# Patient Record
Sex: Male | Born: 1960 | Race: White | Hispanic: No | Marital: Married | Smoking: Never smoker
Health system: Southern US, Community
[De-identification: ages and names within clinical notes are randomized; demographics above are authoritative.]

## PROBLEM LIST (undated history)

## (undated) DIAGNOSIS — I739 Peripheral vascular disease, unspecified: Secondary | ICD-10-CM

## (undated) DIAGNOSIS — Z9289 Personal history of other medical treatment: Secondary | ICD-10-CM

## (undated) DIAGNOSIS — M479 Spondylosis, unspecified: Secondary | ICD-10-CM

## (undated) DIAGNOSIS — K589 Irritable bowel syndrome without diarrhea: Secondary | ICD-10-CM

## (undated) DIAGNOSIS — I10 Essential (primary) hypertension: Secondary | ICD-10-CM

## (undated) DIAGNOSIS — E785 Hyperlipidemia, unspecified: Secondary | ICD-10-CM

## (undated) HISTORY — DX: Personal history of other medical treatment: Z92.89

## (undated) HISTORY — PX: TONSILLECTOMY: SUR1361

## (undated) HISTORY — DX: Peripheral vascular disease, unspecified: I73.9

## (undated) HISTORY — DX: Irritable bowel syndrome, unspecified: K58.9

## (undated) HISTORY — PX: VASECTOMY: SHX75

## (undated) HISTORY — DX: Spondylosis, unspecified: M47.9

## (undated) HISTORY — DX: Essential (primary) hypertension: I10

## (undated) HISTORY — DX: Hyperlipidemia, unspecified: E78.5

---

## 1998-01-17 ENCOUNTER — Encounter: Admission: RE | Admit: 1998-01-17 | Discharge: 1998-04-17 | Payer: Self-pay | Admitting: *Deleted

## 1999-01-25 ENCOUNTER — Encounter: Payer: Self-pay | Admitting: Family Medicine

## 1999-01-25 ENCOUNTER — Ambulatory Visit (HOSPITAL_COMMUNITY): Admission: RE | Admit: 1999-01-25 | Discharge: 1999-01-25 | Payer: Self-pay | Admitting: Family Medicine

## 2001-04-07 ENCOUNTER — Inpatient Hospital Stay (HOSPITAL_COMMUNITY): Admission: EM | Admit: 2001-04-07 | Discharge: 2001-04-08 | Payer: Self-pay | Admitting: Gastroenterology

## 2003-09-18 LAB — HM COLONOSCOPY

## 2003-09-19 ENCOUNTER — Encounter: Payer: Self-pay | Admitting: Gastroenterology

## 2003-09-28 ENCOUNTER — Emergency Department (HOSPITAL_COMMUNITY): Admission: EM | Admit: 2003-09-28 | Discharge: 2003-09-28 | Payer: Self-pay | Admitting: Emergency Medicine

## 2009-06-01 LAB — POC HEMOCCULT BLD/STL (HOME/3-CARD/SCREEN)

## 2010-04-20 ENCOUNTER — Telehealth: Payer: Self-pay | Admitting: Gastroenterology

## 2010-06-19 NOTE — Procedures (Signed)
Summary: colonoscopy   Colonoscopy  Procedure date:  09/19/2003  Findings:      Location:  [facility].   Patient Name: Levi Riddle, Levi Riddle MRN: [id number] Procedure Procedures: Colonoscopy CPT: 54627.  Personnel: Endoscopist: Vania Rea. Jarold Motto, MD.  Exam Location: Exam performed in Outpatient Clinic. Outpatient  Patient Consent: Procedure, Alternatives, Risks and Benefits discussed, consent obtained, from patient. Consent was obtained by the RN.  Indications Symptoms: Constipation Patient's stools are infrequent. Abdominal pain / bloating.  History  Current Medications: Patient is not currently taking Coumadin.  Pre-Exam Physical: Performed Sep 19, 2003. Cardio-pulmonary exam, Rectal exam, Abdominal exam, Extremity exam, Mental status exam WNL.  Exam Exam: Extent of exam reached: Cecum, extent intended: Cecum.  The cecum was identified by appendiceal orifice and IC valve. Patient position: on left side. Duration of exam: 20 minutes. Colon retroflexion performed. Images taken. ASA Classification: I. Tolerance: excellent.  Monitoring: Pulse and BP monitoring, Oximetry used. Supplemental O2 given. at 2 Liters.  Colon Prep Used Golytely for colon prep. Prep results: excellent.  Sedation Meds: Patient assessed and found to be appropriate for moderate (conscious) sedation. Fentanyl 100 mcg. given IV. Versed 10 mg. given IV.  Instrument(s): CF 140L. Serial [id number].  Findings - NORMAL EXAM: Cecum to Rectum. Not Seen: Polyps. AVM's. Colitis. Tumors. Melanosis. Crohn's. Diverticulosis. Hemorrhoids.   Assessment Normal examination.  Events  Unplanned Interventions: No intervention was required.  Plans Medication Plan: Continue current medications.  Disposition: After procedure patient sent to recovery. After recovery patient sent home.  Scheduling/Referral: Follow-Up prn.  cc:  Rudi Heap, MD  This report was created from the original endoscopy  report, which was reviewed and signed by the above listed endoscopist.

## 2010-06-19 NOTE — Progress Notes (Signed)
Summary: next COL?  Phone Note From Other Clinic Call back at [phone]   Caller: Thelma Barge, nurse Call For: Dr. Jarold Motto Reason for Call: Schedule Patient Appt Summary of Call: Dr. Christell Constant would like to know when pt is due for next COL... last one in 2005 Initial call taken by: Vallarie Mare,  April 20, 2010 8:53 AM  Follow-up for Phone Call        Dr. Jarold Motto last colon was 09/19/2003. Is 09/18/2013 correct recall date? According to our records no family history of colon ca.  Follow-up by: Selinda Michaels RN,  April 20, 2010 9:24 AM    Additional Follow-up for Phone Call Additional follow up Details #2::    He previously was having bowel issues. I will be glad to see him in the office. Standard protocol followup for his colonoscopy would be in another 5 years. Thanks. Follow-up by: Mardella Layman MD [facility],  April 20, 2010 10:04 AM  Additional Follow-up for Phone Call Additional follow up Details #3:: Details for Additional Follow-up Action Taken: Spoke with Thelma Barge at Dr [facility] office and let her know the repeat Colon date would be 2015 per Dr. Jarold Motto. Recall date entered.

## 2010-08-10 ENCOUNTER — Encounter: Payer: Self-pay | Admitting: Family Medicine

## 2010-08-10 DIAGNOSIS — I739 Peripheral vascular disease, unspecified: Secondary | ICD-10-CM

## 2010-08-10 DIAGNOSIS — N400 Enlarged prostate without lower urinary tract symptoms: Secondary | ICD-10-CM | POA: Insufficient documentation

## 2010-08-10 DIAGNOSIS — M479 Spondylosis, unspecified: Secondary | ICD-10-CM | POA: Insufficient documentation

## 2010-08-10 DIAGNOSIS — K589 Irritable bowel syndrome without diarrhea: Secondary | ICD-10-CM

## 2010-08-10 DIAGNOSIS — I10 Essential (primary) hypertension: Secondary | ICD-10-CM | POA: Insufficient documentation

## 2010-08-10 DIAGNOSIS — E785 Hyperlipidemia, unspecified: Secondary | ICD-10-CM | POA: Insufficient documentation

## 2010-10-05 NOTE — Discharge Summary (Signed)
[facility]  Patient:    Levi Riddle, Levi Riddle Visit Number: [id number] MRN: [id number]          Service Type: MED Location: [id number] [phone] Attending Physician:  Mardella Layman Dictated by:   Sammuel Cooper, P.A. Admit Date:  04/07/2001 Discharge Date: 04/08/2001   CC:         Monica Becton, M.D.   Discharge Summary  ADMITTING DIAGNOSES: 30. A 50 year old white male with acute gastrointestinal bleed, rule out upper    versus lower source, possible aspirin-induced peptic ulcer disease. 2. Anemia secondary to above, acute normocytic. 3. Tachycardia secondary to intravascular volume depletion. 4. Positive family history of colon cancer.  DISCHARGE DIAGNOSES: 1. Stable status post acute upper gastrointestinal bleed secondary to    aspirin-induced duodenal ulcer.  CLOtest done and pending, not found in the    chart at the time of dictation. 2. Normal colonoscopy. 3. Anemia secondary to acute upper gastrointestinal bleed, stable status post    transfusion.  PROCEDURES:  Upper endoscopy and colonoscopy per Dr. Yancey Flemings and transfusion x 2.  CONSULTATIONS:  None.  BRIEF HISTORY:  Levi Riddle is a pleasant 50 year old white male who was referred to the office via Dr. Charise Killian office in [place] with black stools. Patient had started having melena on April 02, 2001, had four bowel movements that day, had been having two to three bowel movements each day since.  Over the past 48 hours, his stools apparently had become more normal in color.  He had been seen by Dr. Buel Ream PA on April 03, 2001, was noted to be heme positive with hemoglobin of 11.8.  At this time, he presents to our office complaining of feeling very pale and "exhausted" and has had dizziness with standing and a sensation of a racing pulse over the past 24 hours or so. He denied any abdominal pain, cramping, nausea, or vomiting.  His appetite has been fine, his weight had been  stable.  He had no complaints of heartburn, indigestion, or dysphagia.  He had been taking two aspirin perhaps three times per week but no other regular medications.  He is a nonsmoker, nondrinker. Patient was seen and evaluated in the office, noted to be tachycardic with a pulse of 120 resting.  Stool was dark brown and heme positive.  The patient was admitted to the hospital with an acute upper GI bleed for stabilization and transfusions, and diagnostic workup.  ADMITTING LABS:  On November 19, WBC 7.4, hemoglobin 8.1, hematocrit 23.4, MCV 92.7, platelets 270.  Follow-up post transfusion on November 20, hemoglobin 9.4, hematocrit 27.7, pro time 12.8, INR 1.0.  Electrolytes within normal limits, BUN 10, creatinine 1.0, albumin 4.  Liver function studies normal with the exception of an SGPT of 41.  Blood type O positive.  HOSPITAL COURSE:  Patient was admitted to the service of Dr. Sheryn Bison and then cared for by Dr. Marina Goodell who was covering the hospital.  He was initially started on IV fluids at 100 cc an hour, started on IV Protonix. Baseline labs were obtained and, as suspected, his hemoglobin was in the 8 range.  He was then transfused two units of packed RBCs.  The patient was scheduled to undergo upper endoscopy and colonoscopy per Dr. Yancey Flemings on November 19.  He tolerated this prep without difficulty.  There was some concern about possible lower GI source given positive family history of colon cancer and lack of other symptomatology.  Colonoscopy was  normal exam and upper endoscopy showed a duodenal ulcer 10 mm which was clean based.  CLOtest was done; however, results were pending on discharge and are not found in the chart at the time of this dictation.  This will be followed up on an outpatient basis.  The patient was anxious for discharge and was allowed discharge to home later that day on November 20.  He was to follow up with Dr. Jarold Motto December 17 at 3:45 p.m., to  call for any problems with recurrent bleeding or other problems in the interim.  He was asked to go by Dr. Buel Ream office in four to five days to repeat his hemoglobin.  CONDITION ON DISCHARGE:  Stable and improved.  DIET:  Regular.  MEDICATIONS:  Protonix 40 b.i.d. x 2 weeks and then q.d. thereafter.  If he is CLO positive, will treat as an outpatient.  FOLLOW-UP:  Plan follow-up routine colonoscopy in five years. Dictated by:   Sammuel Cooper, P.A. Attending Physician:  Mardella Layman DD:  04/15/01 TD:  04/15/01 Job: [id number]

## 2010-10-05 NOTE — H&P (Signed)
[facility]  Patient:    Levi Riddle, Levi Riddle Visit Number: [id number] MRN: [id number]          Service Type: Attending:  Barry Dienes. Eloise Harman, M.D. Dictated by:   Sammuel Cooper, P.A.C.   CC:         Dr. Christell Constant, [place], [place]   History and Physical  DATE OF BIRTH:  2060/10/15  CHIEF COMPLAINT:  Black stools and weakness.  HISTORY OF PRESENT ILLNESS:  Levi Riddle is a 50 year old white male generally in good health with no known chronic medical problems.  He had acute onset on Thursday, April 02, 2001, with black bowel movements and had approximately four bowel movements that day.  He says he had about three bowel movements the following day and then two bowel movements each day thereafter, all black.  He did not notice any bright red or burgundy stool.  Yesterday and today, his bowel movements have been "more normal in color."  He was seen by by Dr. Buel Ream office on Friday, April 03, 2001, was noted to be heme positive and had a hemoglobin of 11.8 with normocytic indices that day.  He was referred here for further evaluation.  The patient denies any abdominal pain, cramping, nausea, or vomiting.  He states that his appetite has been fine and his weight has been stable.  He denies any chronic problems with heartburn, indigestion, dysphagia, or odynophagia.  He says that his bowels have been normal until acute onset of his symptoms last Thursday.  He does take two aspirin, perhaps three times per week.  No other regular medications.  No ETOH and no tobacco.  His family history is positive for colon cancer in his mother, who was diagnosed at 49 and died at 7.  The patient reports that he had a flexible sigmoidoscopy approximately 10 years ago, which was normal.  He does not remember the physicians name who did the procedure.  He was seen and evaluated in our office today, noted to be quite pale and complaining of feeling "exhausted."  He is dizzy with  standing and says that his pulse has been racing over the past couple of days.  He is admitted at this time for hydration and transfusions as indicated and a further diagnostic work-up for acute GI bleed with EGD and colonoscopy.  CURRENT MEDICATIONS:  None regular.  ALLERGIES:  No known drug allergies.  PAST MEDICAL HISTORY:  Benign.  No surgeries or chronic illnesses.  SOCIAL HISTORY:  The patient is married.  He has two children.  He is employed as a Landscape architect.  No ETOH and no tobacco.  FAMILY HISTORY:  Pertinent for metastatic colon CA in his mother, deceased in her early 37s.  Father alive at 70 with prostate CA.  There are no other family members with colon cancers, polyps, or inflammatory bowel disease.  REVIEW OF SYSTEMS:  Cardiovascular:  Pertinent for palpitations and heart racing over the past few days.  No chest pain.  Pulmonary:  Pertinent for shortness of breath with exertion over the past few days.  Genitourinary: Negative for dysuria, urgency, or frequency.  GI:  As outlined above.  PHYSICAL EXAMINATION:  A well-developed white male in no acute distress.  He is alert and oriented x 3.  He is quite pale.  VITAL SIGNS:  The pulse is 120 lying and blood pressure 156/84.  WEIGHT:  190 pounds.  HEENT:  Normocephalic and atraumatic.  EOMI.  PERRLA.  Sclerae anicteric.  The conjunctivae are  pale.  CARDIOVASCULAR:  Tachy regular rhythm with S1 and S2.  PULMONARY:  Clear to A&P.  ABDOMEN:  Soft.  Bowel sounds are active.  He is nontender.  There is no mass or hepatosplenomegaly.  RECTAL:  Brown stool, 3+ positive for blood.  There is no mass.  EXTREMITIES:  Without clubbing, cyanosis, or edema.  LABORATORY DATA:  Pending at the time of this dictation.  IMPRESSION: 1. A 49 year old white male with acute gastrointestinal bleed, rule out upper    versus lower, possible aspirin-induced peptic ulcer disease. 2. Anemia secondary to above, acute normocytic. 3.  Tachycardia secondary to intravascular volume depletion. 4. Positive family history of colon carcinoma in the patients mother.  PLAN:  The patient is admitted to the service of Barry Dienes. Eloise Harman, M.D., for IV fluid hydration.  Will check a stat CBC and transfuse him as indicated.  He will be covered with IV Protonix.  Will plan colonoscopy and EGD on April 08, 2001.  For details, please see the orders. Dictated by:   Sammuel Cooper, P.A.C. Attending:  Barry Dienes. Eloise Harman, M.D. DD:  04/07/01 TD:  04/07/01 Job: [id number]

## 2013-06-29 ENCOUNTER — Ambulatory Visit: Payer: Self-pay | Admitting: Family Medicine

## 2013-08-11 ENCOUNTER — Encounter: Payer: Self-pay | Admitting: Family Medicine

## 2013-08-26 ENCOUNTER — Encounter: Payer: Self-pay | Admitting: Family Medicine

## 2013-08-26 ENCOUNTER — Ambulatory Visit (INDEPENDENT_AMBULATORY_CARE_PROVIDER_SITE_OTHER): Payer: BC Managed Care – PPO | Admitting: Family Medicine

## 2013-08-26 ENCOUNTER — Encounter (INDEPENDENT_AMBULATORY_CARE_PROVIDER_SITE_OTHER): Payer: Self-pay

## 2013-08-26 ENCOUNTER — Ambulatory Visit (INDEPENDENT_AMBULATORY_CARE_PROVIDER_SITE_OTHER): Payer: BC Managed Care – PPO

## 2013-08-26 VITALS — BP 124/81 | HR 55 | Temp 97.3°F | Ht 67.0 in | Wt 187.0 lb

## 2013-08-26 DIAGNOSIS — Z0000 Encounter for general adult medical examination without abnormal findings: Secondary | ICD-10-CM

## 2013-08-26 DIAGNOSIS — E559 Vitamin D deficiency, unspecified: Secondary | ICD-10-CM

## 2013-08-26 DIAGNOSIS — M47817 Spondylosis without myelopathy or radiculopathy, lumbosacral region: Secondary | ICD-10-CM

## 2013-08-26 DIAGNOSIS — E785 Hyperlipidemia, unspecified: Secondary | ICD-10-CM

## 2013-08-26 DIAGNOSIS — I10 Essential (primary) hypertension: Secondary | ICD-10-CM

## 2013-08-26 DIAGNOSIS — N400 Enlarged prostate without lower urinary tract symptoms: Secondary | ICD-10-CM

## 2013-08-26 LAB — POCT URINALYSIS DIPSTICK
Bilirubin, UA: NEGATIVE
GLUCOSE UA: NEGATIVE
KETONES UA: NEGATIVE
Leukocytes, UA: NEGATIVE
Nitrite, UA: NEGATIVE
Protein, UA: NEGATIVE
SPEC GRAV UA: 1.015
UROBILINOGEN UA: NEGATIVE
pH, UA: 6

## 2013-08-26 LAB — POCT CBC
GRANULOCYTE PERCENT: 64.4 % (ref 37–80)
HCT, POC: 49.1 % (ref 43.5–53.7)
HEMOGLOBIN: 16.1 g/dL (ref 14.1–18.1)
LYMPH, POC: 1.6 (ref 0.6–3.4)
MCH: 30.7 pg (ref 27–31.2)
MCHC: 32.8 g/dL (ref 31.8–35.4)
MCV: 93.6 fL (ref 80–97)
MPV: 8.6 fL (ref 0–99.8)
PLATELET COUNT, POC: 169 10*3/uL (ref 142–424)
POC Granulocyte: 3.5 (ref 2–6.9)
POC LYMPH PERCENT: 29.7 %L (ref 10–50)
RBC: 5.2 M/uL (ref 4.69–6.13)
RDW, POC: 13.1 %
WBC: 5.4 10*3/uL (ref 4.6–10.2)

## 2013-08-26 LAB — POCT UA - MICROSCOPIC ONLY
CASTS, UR, LPF, POC: NEGATIVE
Crystals, Ur, HPF, POC: NEGATIVE
MUCUS UA: NEGATIVE
WBC, Ur, HPF, POC: NEGATIVE
Yeast, UA: NEGATIVE

## 2013-08-26 MED ORDER — OLMESARTAN MEDOXOMIL 20 MG PO TABS: 20.0000 mg | ORAL_TABLET | Freq: Every day | ORAL | Status: DC

## 2013-08-26 MED ORDER — GABAPENTIN 100 MG PO CAPS
200.0000 mg | ORAL_CAPSULE | Freq: Every day | ORAL | Status: DC
Start: 2013-08-26 — End: 2013-09-08

## 2013-08-26 NOTE — Patient Instructions (Addendum)
Continue current medications. Continue good therapeutic lifestyle changes which include good diet and exercise. Fall precautions discussed with patient. If an FOBT was given today- please return it to our front desk. If you are over 55 years old - you may need Prevnar 13 or the adult Pneumonia vaccine and zostavax Take meds as directed Consider looking at new mattresses Call GI if they do not contact you about colonoscopy.

## 2013-08-26 NOTE — Progress Notes (Signed)
Subjective:    Patient ID: Levi Riddle, male    DOB: [birth year], 53 y.o.   MRN: [id number]  HPI Patient is here today for annual wellness exam and follow up of chronic medical problems. The patient also complains of insomnia         Patient Active Problem List   Diagnosis Date Noted  . Hyperplasia of prostate   . Other and unspecified hyperlipidemia   . Essential hypertension, benign   . IBS (irritable bowel syndrome)   . PVD (peripheral vascular disease)   . Spondylosis   . Hyperplasia of prostate    Outpatient Encounter Prescriptions as of 08/26/2013  Medication Sig  . aspirin 81 MG EC tablet Take 81 mg by mouth daily.    . cholecalciferol (VITAMIN D) 1000 UNITS tablet Take 1,000 Units by mouth daily.  Marland Kitchen olmesartan (BENICAR) 20 MG tablet Take 20 mg by mouth daily. 1/2 tab qd   . [DISCONTINUED] omega-3 acid ethyl esters (LOVAZA) 1 G capsule Take 2 g by mouth daily.    . [DISCONTINUED] simvastatin (ZOCOR) 40 MG tablet Take 40 mg by mouth at bedtime.      Review of Systems  Constitutional: Negative.   HENT: Negative.   Eyes: Negative.   Respiratory: Negative.   Cardiovascular: Negative.   Gastrointestinal: Negative.   Endocrine: Negative.   Genitourinary: Negative.   Musculoskeletal: Negative.   Skin: Negative.   Allergic/Immunologic: Negative.   Neurological: Negative.   Hematological: Negative.   Psychiatric/Behavioral: Positive for sleep disturbance.       Objective:   Physical Exam  Nursing note and vitals reviewed. Constitutional: He is oriented to person, place, and time. He appears well-developed and well-nourished. No distress.  HENT:  Head: Normocephalic and atraumatic.  Right Ear: External ear normal.  Left Ear: External ear normal.  Nose: Nose normal.  Mouth/Throat: Oropharynx is clear and moist. No oropharyngeal exudate.  Eyes: Conjunctivae and EOM are normal. Pupils are equal, round, and reactive to light. Right eye exhibits no discharge.  Left eye exhibits no discharge. No scleral icterus.  Neck: Normal range of motion. Neck supple. No thyromegaly present.  Cardiovascular: Normal rate, regular rhythm, normal heart sounds and intact distal pulses.   No murmur heard. At 60 per minute  Pulmonary/Chest: Effort normal and breath sounds normal. No respiratory distress. He has no wheezes. He has no rales. He exhibits no tenderness.  A few wheezes were auscultated initially but with deep breathing these resolved. No axillary nodes  Abdominal: Soft. Bowel sounds are normal. He exhibits no mass. There is no tenderness. There is no rebound and no guarding.  No inguinal nodes  Genitourinary: Rectum normal and penis normal.  The prostate was slightly enlarged without lumps or masses. There were no rectal masses. There are no inguinal hernias palpated. The external genitalia were normal.  Musculoskeletal: Normal range of motion. He exhibits no edema and no tenderness.  Lymphadenopathy:    He has no cervical adenopathy.  Neurological: He is alert and oriented to person, place, and time. He has normal reflexes. No cranial nerve deficit.  Skin: Skin is warm and dry. No rash noted.  Psychiatric: He has a normal mood and affect. His behavior is normal. Judgment and thought content normal.   BP 124/81  Pulse 55  Temp(Src) 97.3 F (36.3 C) (Oral)  Ht _0  (1.702 m)  Wt 187 lb (84.823 kg)  BMI 29.28 kg/m2 EKG: normal EKG [facility] reading (PRIMARY) by  Dr.Joniyah Mallinger-chest x-ray-no active  disease                                       Assessment & Plan:  1. Essential hypertension, benign - POCT CBC - BMP8+EGFR - Hepatic function panel - DG Chest 2 View; Future - EKG 12-Lead  2. Other and unspecified hyperlipidemia - POCT CBC - NMR, lipoprofile - EKG 12-Lead  3. Annual physical exam - POCT CBC - POCT UA - Microscopic Only - POCT urinalysis dipstick - BMP8+EGFR - Hepatic function panel - PSA, total and free - Vit D  25 hydroxy (rtn  osteoporosis monitoring) - NMR, lipoprofile - DG Chest 2 View; Future - EKG 12-Lead  4. Vitamin D deficiency - Vit D  25 hydroxy (rtn osteoporosis monitoring)  5. BPH (benign prostatic hyperplasia) - POCT CBC - POCT UA - Microscopic Only - POCT urinalysis dipstick - PSA, total and free  6. Lumbosacral spondylosis without myelopathy  7. Colon cancer screening -Due in May -Patient should call us in June if not notified by gastroenterology  8. 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 Continue good therapeutic lifestyle changes which include good diet and exercise. Fall precautions discussed with patient. If an FOBT was given today- please return it to our front desk. If you are over 77 years old - you may need Prevnar 30 or the adult Pneumonia vaccine and zostavax Take meds as directed Consider looking at new mattresses Call GI if they do not contact you about colonoscopy.   Arrie Senate MD

## 2013-08-28 LAB — NMR, LIPOPROFILE
CHOLESTEROL: 226 mg/dL — AB (ref <200)
HDL CHOLESTEROL BY NMR: 57 mg/dL (ref >=40)
HDL PARTICLE NUMBER: 35.3 umol/L (ref >=30.5)
LDL PARTICLE NUMBER: 1811 nmol/L — AB (ref <1000)
LDL Size: 21.1 nm (ref >20.5)
LDLC SERPL CALC-MCNC: 151 mg/dL — AB (ref <100)
LP-IR SCORE: 37 (ref <=45)
Small LDL Particle Number: 728 nmol/L — ABNORMAL HIGH (ref <=527)
TRIGLYCERIDES BY NMR: 90 mg/dL (ref <150)

## 2013-08-28 LAB — BMP8+EGFR
BUN / CREAT RATIO: 15 (ref 9–20)
BUN: 14 mg/dL (ref 6–24)
CO2: 25 mmol/L (ref 18–29)
CREATININE: 0.96 mg/dL (ref 0.76–1.27)
Calcium: 9.9 mg/dL (ref 8.7–10.2)
Chloride: 97 mmol/L (ref 97–108)
GFR, EST AFRICAN AMERICAN: 103 mL/min/{1.73_m2} (ref >59)
GFR, EST NON AFRICAN AMERICAN: 89 mL/min/{1.73_m2} (ref >59)
GLUCOSE: 92 mg/dL (ref 65–99)
Potassium: 4.6 mmol/L (ref 3.5–5.2)
Sodium: 140 mmol/L (ref 134–144)

## 2013-08-28 LAB — PSA, TOTAL AND FREE
PSA FREE: 0.3 ng/mL
PSA, Free Pct: 33.3 %
PSA: 0.9 ng/mL (ref 0.0–4.0)

## 2013-08-28 LAB — HEPATIC FUNCTION PANEL
ALBUMIN: 5 g/dL (ref 3.5–5.5)
ALK PHOS: 63 IU/L (ref 39–117)
ALT: 34 IU/L (ref 0–44)
AST: 26 IU/L (ref 0–40)
BILIRUBIN DIRECT: 0.24 mg/dL (ref 0.00–0.40)
BILIRUBIN TOTAL: 1 mg/dL (ref 0.0–1.2)
TOTAL PROTEIN: 7.1 g/dL (ref 6.0–8.5)

## 2013-08-28 LAB — VITAMIN D 25 HYDROXY (VIT D DEFICIENCY, FRACTURES): Vit D, 25-Hydroxy: 47.9 ng/mL (ref 30.0–100.0)

## 2013-09-07 ENCOUNTER — Telehealth: Payer: Self-pay | Admitting: Family Medicine

## 2013-09-08 ENCOUNTER — Other Ambulatory Visit: Payer: Self-pay | Admitting: *Deleted

## 2013-09-08 MED ORDER — GABAPENTIN 100 MG PO CAPS: 200.0000 mg | ORAL_CAPSULE | Freq: Every day | ORAL | Status: DC

## 2013-09-08 MED ORDER — OLMESARTAN MEDOXOMIL 20 MG PO TABS: 10.0000 mg | ORAL_TABLET | Freq: Every day | ORAL | Status: DC

## 2013-09-16 ENCOUNTER — Ambulatory Visit: Payer: BC Managed Care – PPO

## 2013-09-28 ENCOUNTER — Encounter: Payer: Self-pay | Admitting: Internal Medicine

## 2014-07-22 ENCOUNTER — Encounter: Payer: Self-pay | Admitting: Internal Medicine

## 2014-11-16 ENCOUNTER — Ambulatory Visit (INDEPENDENT_AMBULATORY_CARE_PROVIDER_SITE_OTHER): Payer: BLUE CROSS/BLUE SHIELD | Admitting: Family Medicine

## 2014-11-16 ENCOUNTER — Encounter: Payer: Self-pay | Admitting: Family Medicine

## 2014-11-16 VITALS — BP 137/77 | HR 54 | Temp 97.3°F | Ht 67.0 in | Wt 186.0 lb

## 2014-11-16 DIAGNOSIS — N400 Enlarged prostate without lower urinary tract symptoms: Secondary | ICD-10-CM

## 2014-11-16 DIAGNOSIS — E559 Vitamin D deficiency, unspecified: Secondary | ICD-10-CM

## 2014-11-16 DIAGNOSIS — Z0000 Encounter for general adult medical examination without abnormal findings: Secondary | ICD-10-CM

## 2014-11-16 DIAGNOSIS — I10 Essential (primary) hypertension: Secondary | ICD-10-CM | POA: Diagnosis not present

## 2014-11-16 LAB — POCT URINALYSIS DIPSTICK
Bilirubin, UA: NEGATIVE
Glucose, UA: NEGATIVE
Ketones, UA: NEGATIVE
Leukocytes, UA: NEGATIVE
NITRITE UA: NEGATIVE
PH UA: 6.5
PROTEIN UA: NEGATIVE
Spec Grav, UA: <=1.005
UROBILINOGEN UA: NEGATIVE

## 2014-11-16 LAB — POCT UA - MICROSCOPIC ONLY
CASTS, UR, LPF, POC: NEGATIVE
Crystals, Ur, HPF, POC: NEGATIVE
MUCUS UA: NEGATIVE
WBC, Ur, HPF, POC: NEGATIVE
Yeast, UA: NEGATIVE

## 2014-11-16 LAB — POCT CBC
GRANULOCYTE PERCENT: 60.7 % (ref 37–80)
HEMATOCRIT: 49.8 % (ref 43.5–53.7)
HEMOGLOBIN: 16 g/dL (ref 14.1–18.1)
Lymph, poc: 1.5 (ref 0.6–3.4)
MCH, POC: 29.7 pg (ref 27–31.2)
MCHC: 32.1 g/dL (ref 31.8–35.4)
MCV: 92.5 fL (ref 80–97)
MPV: 8.9 fL (ref 0–99.8)
POC GRANULOCYTE: 2.9 (ref 2–6.9)
POC LYMPH PERCENT: 31.3 %L (ref 10–50)
Platelet Count, POC: 163 10*3/uL (ref 142–424)
RBC: 5.38 M/uL (ref 4.69–6.13)
RDW, POC: 12.7 %
WBC: 4.7 10*3/uL (ref 4.6–10.2)

## 2014-11-16 MED ORDER — OLMESARTAN MEDOXOMIL 20 MG PO TABS: 10.0000 mg | ORAL_TABLET | Freq: Every day | ORAL | Status: DC

## 2014-11-16 MED ORDER — ALPRAZOLAM 0.25 MG PO TABS
ORAL_TABLET | ORAL | Status: DC
Start: 2014-11-16 — End: 2015-06-21

## 2014-11-16 NOTE — Progress Notes (Signed)
Subjective:    Patient ID: Levi Riddle, male    DOB: 02/02/1961, 54 y.o.   MRN: [id number]  HPI Patient is here today for annual wellness exam and follow up of chronic medical problems which includes hypertension. He is taking medications regularly. The blood pressures at home have been running in the 120s over the 70s. The patient denies chest pain shortness of breath GI symptoms voiding symptoms or sexual problems. He has occasional aches and pains but no more than he would expect. He does have a problem with claustrophobia and worries that he may get a situation again that he may have some kind of panic attack. He is in need of getting a colonoscopy.       Patient Active Problem List   Diagnosis Date Noted  . BPH   . Hyperlipidemia   . Essential hypertension, benign   . IBS (irritable bowel syndrome)   . Spondylosis    Outpatient Encounter Prescriptions as of 11/16/2014  Medication Sig  . aspirin 81 MG EC tablet Take 81 mg by mouth daily.    . cholecalciferol (VITAMIN D) 1000 UNITS tablet Take 1,000 Units by mouth daily.  Marland Kitchen gabapentin (NEURONTIN) 100 MG capsule Take 2 capsules (200 mg total) by mouth at bedtime. As directed  . olmesartan (BENICAR) 20 MG tablet Take 0.5 tablets (10 mg total) by mouth daily. 1/2 tab qd   No facility-administered encounter medications on file as of 11/16/2014.     Review of Systems  Constitutional: Negative.   HENT: Negative.   Eyes: Negative.   Respiratory: Negative.   Cardiovascular: Negative.   Gastrointestinal: Negative.   Endocrine: Negative.   Genitourinary: Negative.   Musculoskeletal: Negative.   Skin: Negative.   Allergic/Immunologic: Negative.   Neurological: Negative.   Hematological: Negative.   Psychiatric/Behavioral: The patient is nervous/anxious.        Objective:   Physical Exam  Constitutional: He is oriented to person, place, and time. He appears well-developed and well-nourished. No distress.  HENT:  Head:  Normocephalic and atraumatic.  Right Ear: External ear normal.  Left Ear: External ear normal.  Mouth/Throat: Oropharynx is clear and moist. No oropharyngeal exudate.  Nasal congestion bilaterally  Eyes: Conjunctivae and EOM are normal. Pupils are equal, round, and reactive to light. Right eye exhibits no discharge. Left eye exhibits no discharge. No scleral icterus.  Neck: Normal range of motion. Neck supple. No thyromegaly present.  No bruits or thyromegaly or anterior cervical adenopathy  Cardiovascular: Normal rate, regular rhythm, normal heart sounds and intact distal pulses.   No murmur heard. At 72/m  Pulmonary/Chest: Effort normal and breath sounds normal. No respiratory distress. He has no wheezes. He has no rales. He exhibits no tenderness.  Clear anteriorly and posteriorly  Abdominal: Soft. Bowel sounds are normal. He exhibits no mass. There is no tenderness. There is no rebound and no guarding.  No abdominal tenderness masses or inguinal adenopathy  Genitourinary: Rectum normal and penis normal.  The prostate is minimally enlarged without lumps or masses. There are no rectal masses. The external genitalia were normal and there were no inguinal hernias palpated.  Musculoskeletal: Normal range of motion. He exhibits no edema or tenderness.  Lymphadenopathy:    He has no cervical adenopathy.  Neurological: He is alert and oriented to person, place, and time. He has normal reflexes. No cranial nerve deficit.  Skin: Skin is warm and dry. No rash noted. No erythema. No pallor.  Psychiatric: He has a  normal mood and affect. His behavior is normal. Judgment and thought content normal.  Nursing note and vitals reviewed.   BP 137/77 mmHg  Pulse 54  Temp(Src) 97.3 F (36.3 C) (Oral)  Ht _0  (1.702 m)  Wt 186 lb (84.369 kg)  BMI 29.12 kg/m2       Assessment & Plan:  1. Annual physical exam -The patient is due to get his colonoscopy and he will call and arrange this and if he  has any problems he will call us back. He should check with his insurance regarding the Prevnar vaccine and the shingles shot - POCT CBC - BMP8+EGFR - Hepatic function panel - Lipid panel - Vit D  25 hydroxy (rtn osteoporosis monitoring) - POCT urinalysis dipstick - POCT UA - Microscopic Only - PSA  2. Essential hypertension, benign -He should continue current treatment and continue to monitor blood pressures at home and watch his sodium intake - POCT CBC - BMP8+EGFR - Hepatic function panel - Lipid panel - olmesartan (BENICAR) 20 MG tablet; Take 0.5 tablets (10 mg total) by mouth daily. 1/2 tab qd  Dispense: 90 tablet; Refill: 3  3. BPH (benign prostatic hyperplasia) -The prostate was enlarged but he is not having any symptoms with this or with his sexual life. - POCT CBC - POCT urinalysis dipstick - POCT UA - Microscopic Only - PSA  4. Vitamin D deficiency -He should continue current vitamin D treatment pending results of lab work - POCT CBC - Vit D  25 hydroxy (rtn osteoporosis monitoring)  Meds ordered this encounter  Medications  . olmesartan (BENICAR) 20 MG tablet    Sig: Take 0.5 tablets (10 mg total) by mouth daily. 1/2 tab qd    Dispense:  90 tablet    Refill:  3  . ALPRAZolam (XANAX) 0.25 MG tablet    Sig: 1/2 to 1 whole tab daily PRN.    Dispense:  14 tablet    Refill:  0   Patient Instructions  Continue current medications. Continue good therapeutic lifestyle changes which include good diet and exercise. Fall precautions discussed with patient. If an FOBT was given today- please return it to our front desk. If you are over 48 years old - you may need Prevnar 30 or the adult Pneumonia vaccine.  Flu Shots are still available at our office. If you still haven't had one please call to set up a nurse visit to get one.   After your visit with [place] today you will receive a survey in the mail or online from [employer] regarding your care with [place]. Please take a  moment to fill this out. Your feedback is very important to [place] as you can help [place] better understand your patient needs as well as improve your experience and satisfaction. WE CARE ABOUT YOU!!!   Continue to watch diet closely and exercise as much as possible and drink plenty of fluids especially water Take the Xanax if needed for anxiety   Arrie Senate MD

## 2014-11-16 NOTE — Patient Instructions (Addendum)
Continue current medications. Continue good therapeutic lifestyle changes which include good diet and exercise. Fall precautions discussed with patient. If an FOBT was given today- please return it to our front desk. If you are over 56 years old - you may need Prevnar 13 or the adult Pneumonia vaccine.  Flu Shots are still available at our office. If you still haven't had one please call to set up a nurse visit to get one.   After your visit with us today you will receive a survey in the mail or online from [employer] regarding your care with us. Please take a moment to fill this out. Your feedback is very important to us as you can help us better understand your patient needs as well as improve your experience and satisfaction. WE CARE ABOUT YOU!!!   Continue to watch diet closely and exercise as much as possible and drink plenty of fluids especially water Take the Xanax if needed for anxiety

## 2014-11-17 LAB — BMP8+EGFR
BUN/Creatinine Ratio: 15 (ref 9–20)
BUN: 14 mg/dL (ref 6–24)
CALCIUM: 9.6 mg/dL (ref 8.7–10.2)
CO2: 24 mmol/L (ref 18–29)
Chloride: 101 mmol/L (ref 97–108)
Creatinine, Ser: 0.91 mg/dL (ref 0.76–1.27)
GFR calc non Af Amer: 94 mL/min/{1.73_m2} (ref >59)
GFR, EST AFRICAN AMERICAN: 109 mL/min/{1.73_m2} (ref >59)
Glucose: 94 mg/dL (ref 65–99)
Potassium: 4.3 mmol/L (ref 3.5–5.2)
Sodium: 141 mmol/L (ref 134–144)

## 2014-11-17 LAB — LIPID PANEL
Chol/HDL Ratio: 4.1 ratio (ref 0.0–5.0)
Cholesterol, Total: 236 mg/dL — ABNORMAL HIGH (ref 100–199)
HDL: 57 mg/dL
LDL Calculated: 162 mg/dL — ABNORMAL HIGH (ref 0–99)
Triglycerides: 86 mg/dL (ref 0–149)
VLDL Cholesterol Cal: 17 mg/dL (ref 5–40)

## 2014-11-17 LAB — VITAMIN D 25 HYDROXY (VIT D DEFICIENCY, FRACTURES): Vit D, 25-Hydroxy: 39.3 ng/mL (ref 30.0–100.0)

## 2014-11-17 LAB — HEPATIC FUNCTION PANEL
ALT: 28 IU/L (ref 0–44)
AST: 21 IU/L (ref 0–40)
Albumin: 4.8 g/dL (ref 3.5–5.5)
Alkaline Phosphatase: 51 IU/L (ref 39–117)
Bilirubin Total: 0.7 mg/dL (ref 0.0–1.2)
Bilirubin, Direct: 0.18 mg/dL (ref 0.00–0.40)
Total Protein: 6.7 g/dL (ref 6.0–8.5)

## 2014-11-17 LAB — PSA: Prostate Specific Ag, Serum: 1.1 ng/mL (ref 0.0–4.0)

## 2015-06-21 ENCOUNTER — Ambulatory Visit (INDEPENDENT_AMBULATORY_CARE_PROVIDER_SITE_OTHER): Payer: BLUE CROSS/BLUE SHIELD | Admitting: Family Medicine

## 2015-06-21 VITALS — BP 138/87 | HR 80 | Temp 98.1°F | Ht 67.0 in | Wt 193.8 lb

## 2015-06-21 DIAGNOSIS — R21 Rash and other nonspecific skin eruption: Secondary | ICD-10-CM

## 2015-06-21 NOTE — Progress Notes (Signed)
   HPI  Patient presents today here with rash.  Patient explains that he's had this rash for 4-6 weeks.  Initially he cut out scented lotions, long hot showers, used unscented lotions that were recommended, and plain soap. He also used an antifungal shampoo with no improvement. He saw a dermatologist about 4 weeks ago who prescribed Lidex ointment which he has used BID X 4 weeks with no improvement  He has not tried any antihistamines. He has not had allergy testing.  He has no areas of fluctuance, drainage, or concern for infection.  Otherwise he feels well It is slightly itchy.   PMH: Smoking status noted ROS: Per HPI  Objective: BP 138/87 mmHg  Pulse 80  Temp(Src) 98.1 F (36.7 C) (Oral)  Ht  (1.702 m)  Wt 193 lb 12.8 oz (87.907 kg)  BMI 30.35 kg/m2 Gen: NAD, alert, cooperative with exam HEENT: NCAT Ext: No edema, warm Neuro: Alert and oriented, No gross deficits  Skin: 100-150 erythematous papules on bilateral sides of his upper chest, and bilateral anterior neck/jaw line, no excoriations, no areas of fluctuance No scale   Assessment and plan:  # Rash Most consistent with presentation of atopic dermatitis/eczema However he's had very good treatment for this. Recommended daily antihistamine (claritin, allegra etc)and follow-up with his dermatologist to consider allergy testing.    Meds ordered this encounter  Medications  . fluocinonide cream (LIDEX) 0.05 %    Sig: Apply 1 application topically 2 (two) times daily.    Murtis Sink, MD [facility] 06/21/2015, 6:59 PM

## 2015-09-05 ENCOUNTER — Other Ambulatory Visit: Payer: Self-pay | Admitting: Nurse Practitioner

## 2015-09-05 DIAGNOSIS — E785 Hyperlipidemia, unspecified: Secondary | ICD-10-CM

## 2015-09-05 DIAGNOSIS — I10 Essential (primary) hypertension: Secondary | ICD-10-CM

## 2015-09-05 DIAGNOSIS — L500 Allergic urticaria: Secondary | ICD-10-CM

## 2015-09-08 ENCOUNTER — Ambulatory Visit (INDEPENDENT_AMBULATORY_CARE_PROVIDER_SITE_OTHER): Payer: BLUE CROSS/BLUE SHIELD | Admitting: Family Medicine

## 2015-09-08 ENCOUNTER — Ambulatory Visit (INDEPENDENT_AMBULATORY_CARE_PROVIDER_SITE_OTHER): Payer: BLUE CROSS/BLUE SHIELD

## 2015-09-08 ENCOUNTER — Encounter: Payer: Self-pay | Admitting: Family Medicine

## 2015-09-08 VITALS — BP 138/90 | HR 62 | Temp 97.4°F | Ht 67.0 in | Wt 185.0 lb

## 2015-09-08 DIAGNOSIS — R21 Rash and other nonspecific skin eruption: Secondary | ICD-10-CM

## 2015-09-08 DIAGNOSIS — I10 Essential (primary) hypertension: Secondary | ICD-10-CM

## 2015-09-08 DIAGNOSIS — E559 Vitamin D deficiency, unspecified: Secondary | ICD-10-CM

## 2015-09-08 DIAGNOSIS — N400 Enlarged prostate without lower urinary tract symptoms: Secondary | ICD-10-CM

## 2015-09-08 DIAGNOSIS — Z0000 Encounter for general adult medical examination without abnormal findings: Secondary | ICD-10-CM | POA: Diagnosis not present

## 2015-09-08 DIAGNOSIS — Z1211 Encounter for screening for malignant neoplasm of colon: Secondary | ICD-10-CM

## 2015-09-08 LAB — URINALYSIS, COMPLETE
Bilirubin, UA: NEGATIVE
Glucose, UA: NEGATIVE
KETONES UA: NEGATIVE
LEUKOCYTES UA: NEGATIVE
NITRITE UA: NEGATIVE
Protein, UA: NEGATIVE
RBC, UA: NEGATIVE
SPEC GRAV UA: 1.01 (ref 1.005–1.030)
Urobilinogen, Ur: 0.2 mg/dL (ref 0.2–1.0)
pH, UA: 7 (ref 5.0–7.5)

## 2015-09-08 LAB — MICROSCOPIC EXAMINATION
BACTERIA UA: NONE SEEN
EPITHELIAL CELLS (NON RENAL): NONE SEEN /HPF (ref 0–10)
RBC MICROSCOPIC, UA: NONE SEEN /HPF (ref 0–?)
WBC, UA: NONE SEEN /hpf (ref 0–?)

## 2015-09-08 NOTE — Progress Notes (Signed)
Subjective:    Patient ID: Levi Riddle, male    DOB: January 24, 1961, 55 y.o.   MRN: [id number]  HPI Patient is here today for annual wellness exam and follow up of chronic medical problems which includes hypertension. He is taking medications regularly.Today, he complains of a skin rash with pruritus on his upper body. He thinks it is a food allergy. He is due to get lab work an FOBT in chest x-ray urine and an EKG today. The patient denies chest pain shortness of breath trouble swallowing heartburn indigestion nausea vomiting diarrhea or blood in the stool. He is passing his water well without any sexual dysfunction. His most annoying problem is his persistent skin rash which seems to be worse from the waist up and on his chest and upper back and face and scalp. It is pruritic in nature and somewhat inflamed in appearance.     Patient Active Problem List   Diagnosis Date Noted  . Rash and nonspecific skin eruption 06/21/2015  . BPH   . Hyperlipidemia with target LDL less than 100   . Essential hypertension, benign   . IBS (irritable bowel syndrome)   . Spondylosis    Outpatient Encounter Prescriptions as of 09/08/2015  Medication Sig  . aspirin 81 MG EC tablet Take 81 mg by mouth daily.    . cholecalciferol (VITAMIN D) 1000 UNITS tablet Take 1,000 Units by mouth daily.  . fluocinonide cream (LIDEX) 8.25 % Apply 1 application topically 2 (two) times daily.  . hydrOXYzine (ATARAX/VISTARIL) 25 MG tablet TAKE 1 TABLET BY MOUTH EVERY DAY AT BEDTIME AS NEEDED FOR ITCHING  . olmesartan (BENICAR) 20 MG tablet Take 0.5 tablets (10 mg total) by mouth daily. 1/2 tab qd   No facility-administered encounter medications on file as of 09/08/2015.      Review of Systems  Constitutional: Negative.   HENT: Negative.   Eyes: Negative.   Respiratory: Negative.   Cardiovascular: Negative.   Gastrointestinal: Negative.   Endocrine: Negative.   Genitourinary: Negative.   Musculoskeletal: Negative.    Skin: Positive for rash (mostly upper body - itches).  Allergic/Immunologic: Negative.   Neurological: Negative.   Hematological: Negative.   Psychiatric/Behavioral: Negative.        Objective:   Physical Exam  Constitutional: He is oriented to person, place, and time. He appears well-developed and well-nourished. No distress.  HENT:  Head: Normocephalic and atraumatic.  Right Ear: External ear normal.  Left Ear: External ear normal.  Nose: Nose normal.  Mouth/Throat: Oropharynx is clear and moist. No oropharyngeal exudate.  Eyes: Conjunctivae and EOM are normal. Pupils are equal, round, and reactive to light. Right eye exhibits no discharge. Left eye exhibits no discharge. No scleral icterus.  Neck: Normal range of motion. Neck supple. No thyromegaly present.  Cardiovascular: Normal rate, regular rhythm, normal heart sounds and intact distal pulses.   No murmur heard. Heart is regular at 72/m  Pulmonary/Chest: Effort normal and breath sounds normal. No respiratory distress. He has no wheezes. He has no rales. He exhibits no tenderness.  There is no adenopathy in the axillary area or the inguinal area.  Abdominal: Soft. Bowel sounds are normal. He exhibits no mass. There is no tenderness. There is no rebound and no guarding.  Abdomen is nontender without masses liver or spleen enlargement or bruits  Genitourinary: Rectum normal and penis normal.  The prostate is slightly enlarged and smooth. The rectal exam was clear of masses. The external genitalia were normal  and there were no inguinal hernias palpable. The external genitalia were within normal limits.  Musculoskeletal: Normal range of motion. He exhibits no edema.  Lymphadenopathy:    He has no cervical adenopathy.  Neurological: He is alert and oriented to person, place, and time. He has normal reflexes. No cranial nerve deficit.  Skin: Skin is warm and dry. Rash noted. There is erythema. No pallor.  There were areas of  erythema on the anterior chestthe head the neck and shoulders and back. These are pruritic and red in color.  Psychiatric: He has a normal mood and affect. His behavior is normal. Judgment and thought content normal.  Nursing note and vitals reviewed.  BP 142/81 mmHg  Pulse 62  Temp(Src) 97.4 F (36.3 C) (Oral)  Ht 5' 7"  (1.702 m)  Wt 185 lb (83.915 kg)  BMI 28.97 kg/m2  Repeat blood pressure 138/90 in the left arm sitting with a regular cuff  [facility] reading (PRIMARY) by  Dr. Brunilda Payor x-ray-no active disease  EKG: Incomplete right bundle branch block--this is slightly change from previously. We will continue to monitor this.                                        Assessment & Plan:  1. Annual physical exam -We will arrange a visit with the dermatologist to see if we can get another opinion about the cause of the rash -The patient is due for a colonoscopy and will try to arrange this this summer. - BMP8+EGFR - CBC with Differential/Platelet - Hepatic function panel - PSA, total and free - VITAMIN D 25 Hydroxy (Vit-D Deficiency, Fractures) - Thyroid Panel With TSH - Alpha-Gal Panel - DG Chest 2 View; Future - Fecal occult blood, imunochemical; Future - Urinalysis, Complete - EKG 12-Lead - Lipid panel  2. Rash and nonspecific skin eruption -Hold the Benicar and check blood pressures closely until patient comes back for recheck in a couple weeks. Watch sodium intake and try to lose weight - CBC with Differential/Platelet - Thyroid Panel With TSH - Alpha-Gal Panel - Ambulatory referral to Dermatology  3. BPH (benign prostatic hyperplasia) -The prostate remains enlarged without lumps or masses. The patient is having no symptoms with this. - CBC with Differential/Platelet - PSA, total and free - Urinalysis, Complete  4. Essential hypertension, benign -The blood pressure is borderline here in the office today but the readings at home and been running in the 120s over the  70s consistently. - BMP8+EGFR - CBC with Differential/Platelet - Hepatic function panel - DG Chest 2 View; Future - EKG 12-Lead - Lipid panel  5. Vitamin D deficiency -Continue vitamin D replacement pending results of lab work - CBC with Differential/Platelet - VITAMIN D 25 Hydroxy (Vit-D Deficiency, Fractures)  6. Special screening for malignant neoplasms, colon -Patient is aware that he needs to arrange for colonoscopy this summer - Fecal occult blood, imunochemical; Future  7. Rash and nonspecific skin eruption -Try Allegra during the day and hydroxyzine at night -We will hold the Benicar just to make sure this is not playing a role with a rash - CBC with Differential/Platelet - Thyroid Panel With TSH - Alpha-Gal Panel - Ambulatory referral to Dermatology  Meds ordered this encounter  Medications  . hydrOXYzine (ATARAX/VISTARIL) 25 MG tablet    Sig: TAKE 1 TABLET BY MOUTH EVERY DAY AT BEDTIME AS NEEDED FOR ITCHING  Refill:  0   Patient Instructions  Continue current medications. Continue good therapeutic lifestyle changes which include good diet and exercise. Fall precautions discussed with patient. If an FOBT was given today- please return it to our front desk. If you are over 48 years old - you may need Prevnar 18 or the adult Pneumonia vaccine.  **Flu shots are available--- please call and schedule a FLU-CLINIC appointment**  After your visit with [place] today you will receive a survey in the mail or online from [employer] regarding your care with [place]. Please take a moment to fill this out. Your feedback is very important to [place] as you can help [place] better understand your patient needs as well as improve your experience and satisfaction. WE CARE ABOUT YOU!!!     We will arrange an appointment with the head of Dermatology at [facility] We will call you with all lab results [place]  And we will see you back to discuss BP readings in 2-3 weeks. Monitor blood  pressure readings closely at home. Continue to follow recommendations of previous dermatologist. The patient should try a nonsedating antihistamine like Allegra or Claritin during the day Watch sodium intake and try to lose weight as this would help blood pressure   Arrie Senate MD

## 2015-09-08 NOTE — Patient Instructions (Addendum)
Continue current medications. Continue good therapeutic lifestyle changes which include good diet and exercise. Fall precautions discussed with patient. If an FOBT was given today- please return it to our front desk. If you are over 57 years old - you may need Prevnar 13 or the adult Pneumonia vaccine.  **Flu shots are available--- please call and schedule a FLU-CLINIC appointment**  After your visit with us today you will receive a survey in the mail or online from [employer] regarding your care with us. Please take a moment to fill this out. Your feedback is very important to us as you can help us better understand your patient needs as well as improve your experience and satisfaction. WE CARE ABOUT YOU!!!     We [facility] We will call you with all lab results Discontinue the Benicar  And we will see you back to discuss BP readings in 2-3 weeks. Monitor blood pressure readings closely at home. Continue to follow recommendations of previous dermatologist. The patient should try a nonsedating antihistamine like Allegra or Claritin during the day Watch sodium intake and try to lose weight as this would help blood pressure

## 2015-09-12 LAB — BMP8+EGFR
BUN / CREAT RATIO: 13 (ref 9–20)
BUN: 12 mg/dL (ref 6–24)
CHLORIDE: 98 mmol/L (ref 96–106)
CO2: 24 mmol/L (ref 18–29)
CREATININE: 0.96 mg/dL (ref 0.76–1.27)
Calcium: 9.6 mg/dL (ref 8.7–10.2)
GFR calc Af Amer: 102 mL/min/{1.73_m2} (ref >59)
GFR calc non Af Amer: 88 mL/min/{1.73_m2} (ref >59)
GLUCOSE: 86 mg/dL (ref 65–99)
POTASSIUM: 4.2 mmol/L (ref 3.5–5.2)
SODIUM: 140 mmol/L (ref 134–144)

## 2015-09-12 LAB — CBC WITH DIFFERENTIAL/PLATELET
Basophils Absolute: 0 10*3/uL (ref 0.0–0.2)
Basos: 1 %
EOS (ABSOLUTE): 0.1 10*3/uL (ref 0.0–0.4)
EOS: 3 %
HEMATOCRIT: 43.9 % (ref 37.5–51.0)
Hemoglobin: 15.3 g/dL (ref 12.6–17.7)
IMMATURE GRANULOCYTES: 0 %
Immature Grans (Abs): 0 10*3/uL (ref 0.0–0.1)
LYMPHS ABS: 0.9 10*3/uL (ref 0.7–3.1)
Lymphs: 22 %
MCH: 31.5 pg (ref 26.6–33.0)
MCHC: 34.9 g/dL (ref 31.5–35.7)
MCV: 91 fL (ref 79–97)
MONOS ABS: 0.4 10*3/uL (ref 0.1–0.9)
Monocytes: 10 %
NEUTROS PCT: 64 %
Neutrophils Absolute: 2.5 10*3/uL (ref 1.4–7.0)
Platelets: 172 10*3/uL (ref 150–379)
RBC: 4.85 x10E6/uL (ref 4.14–5.80)
RDW: 13.6 % (ref 12.3–15.4)
WBC: 3.9 10*3/uL (ref 3.4–10.8)

## 2015-09-12 LAB — HEPATIC FUNCTION PANEL
ALT: 43 IU/L (ref 0–44)
AST: 32 IU/L (ref 0–40)
Albumin: 4.7 g/dL (ref 3.5–5.5)
Alkaline Phosphatase: 60 IU/L (ref 39–117)
BILIRUBIN TOTAL: 0.8 mg/dL (ref 0.0–1.2)
BILIRUBIN, DIRECT: 0.19 mg/dL (ref 0.00–0.40)
Total Protein: 6.8 g/dL (ref 6.0–8.5)

## 2015-09-12 LAB — THYROID PANEL WITH TSH
FREE THYROXINE INDEX: 2.7 (ref 1.2–4.9)
T3 UPTAKE RATIO: 30 % (ref 24–39)
T4, Total: 9 ug/dL (ref 4.5–12.0)
TSH: 2.61 u[IU]/mL (ref 0.450–4.500)

## 2015-09-12 LAB — PSA, TOTAL AND FREE
PROSTATE SPECIFIC AG, SERUM: 0.9 ng/mL (ref 0.0–4.0)
PSA FREE PCT: 27.8 %
PSA, Free: 0.25 ng/mL

## 2015-09-12 LAB — ALPHA-GAL PANEL
Alpha Gal IgE*: <0.1 kU/L (ref <0.35)
BEEF CLASS INTERPRETATION: 0
Beef (Bos spp) IgE: <0.1 kU/L (ref <0.35)
Class Interpretation: 0
LAMB CLASS INTERPRETATION: 0
Pork (Sus spp) IgE: <0.1 kU/L (ref <0.35)

## 2015-09-12 LAB — VITAMIN D 25 HYDROXY (VIT D DEFICIENCY, FRACTURES): Vit D, 25-Hydroxy: 47.1 ng/mL (ref 30.0–100.0)

## 2015-09-13 ENCOUNTER — Encounter: Payer: Self-pay | Admitting: Family Medicine

## 2015-09-27 ENCOUNTER — Ambulatory Visit (INDEPENDENT_AMBULATORY_CARE_PROVIDER_SITE_OTHER): Payer: BLUE CROSS/BLUE SHIELD | Admitting: Family Medicine

## 2015-09-27 ENCOUNTER — Encounter: Payer: Self-pay | Admitting: Family Medicine

## 2015-09-27 VITALS — BP 131/80 | HR 68 | Temp 97.2°F | Ht 67.0 in | Wt 187.2 lb

## 2015-09-27 DIAGNOSIS — E785 Hyperlipidemia, unspecified: Secondary | ICD-10-CM

## 2015-09-27 DIAGNOSIS — I10 Essential (primary) hypertension: Secondary | ICD-10-CM

## 2015-09-27 DIAGNOSIS — R21 Rash and other nonspecific skin eruption: Secondary | ICD-10-CM | POA: Diagnosis not present

## 2015-09-27 MED ORDER — METHYLPREDNISOLONE ACETATE 80 MG/ML IJ SUSP
80.0000 mg | Freq: Once | INTRAMUSCULAR | Status: AC
  Administered 2015-09-27: 80 mg via INTRAMUSCULAR

## 2015-09-27 MED ORDER — PREDNISONE 10 MG PO TABS: ORAL_TABLET | ORAL | Status: DC

## 2015-09-27 MED ORDER — HYDROXYZINE HCL 25 MG PO TABS: ORAL_TABLET | ORAL | Status: DC

## 2015-09-27 NOTE — Patient Instructions (Signed)
Continue to take hydroxyzine Take prednisone as directed Call in one week regarding progress Avoid the heat as much as possible Try head and shoulders shampoo Keep the home is cool as possible

## 2015-09-27 NOTE — Progress Notes (Signed)
Subjective:    Patient ID: Levi Riddle, male    DOB: 11/27/1960, 55 y.o.   MRN: [id number]  HPI  Patient is here today for a 3 week recheck on his blood pressure and a rash. The patient had an extensive lab panel done in conjunction with his yearly physical. In addition one of our providers had suggested a food allergy panel. All of the lab work was negative. He has an appointment with an allergy skin specialist but this is in June. He still taking hydroxyzine 25 mg with no help. He is using Aveeno and taking call Bass. In fact his rash may be worse. He feels like his in his scalp more on his face on his chest and back and arms. 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 I'm not familiar with that.  Review of Systems  Constitutional: Negative.   HENT: Negative.   Eyes: Negative.   Respiratory: Negative.   Cardiovascular: Negative.   Gastrointestinal: Negative.   Endocrine: Negative.   Genitourinary: Negative.   Musculoskeletal: Negative.   Skin: Positive for rash.  Allergic/Immunologic: Negative.   Neurological: Negative.   Hematological: Negative.   Psychiatric/Behavioral: Negative.           Patient Active Problem List   Diagnosis Date Noted  . Rash and nonspecific skin eruption 06/21/2015  . BPH   . Hyperlipidemia with target LDL less than 100   . Essential hypertension, benign   . IBS (irritable bowel syndrome)   . Spondylosis    Outpatient Encounter Prescriptions as of 09/27/2015  Medication Sig  . cholecalciferol (VITAMIN D) 1000 UNITS tablet Take 1,000 Units by mouth daily.  . hydrOXYzine (ATARAX/VISTARIL) 25 MG tablet TAKE 1 TABLET BY MOUTH EVERY DAY AT BEDTIME AS NEEDED FOR ITCHING  . olmesartan (BENICAR) 20 MG tablet Take 0.5 tablets (10 mg total) by mouth daily. 1/2 tab qd  . aspirin 81 MG EC tablet Take 81 mg by mouth daily.    . [DISCONTINUED] fluocinonide cream (LIDEX) 0.05 % Apply 1 application topically 2 (two) times  daily. Reported on 09/27/2015   No facility-administered encounter medications on file as of 09/27/2015.       Objective:   Physical Exam  Constitutional: He is oriented to person, place, and time. He appears well-developed and well-nourished. No distress.  HENT:  Head: Normocephalic and atraumatic.  Face appears somewhat red and slightly swollen.  Eyes: Conjunctivae and EOM are normal. Pupils are equal, round, and reactive to light. Right eye exhibits no discharge. Left eye exhibits no discharge. No scleral icterus.  Neck: Normal range of motion. Neck supple.  Abdominal: Soft. Bowel sounds are normal. He exhibits no mass.  Musculoskeletal: Normal range of motion. He exhibits no edema.  Neurological: He is alert and oriented to person, place, and time.  Skin: Skin is warm and dry. Rash noted. There is erythema.  There is pruritic rash of the face and upper chest back and arms and a scaling type rash in the scalp. There is erythema.  Psychiatric: He has a normal mood and affect. His behavior is normal. Judgment and thought content normal.  Nursing note and vitals reviewed.  BP 131/80 mmHg  Pulse 68  Temp(Src) 97.2 F (36.2 C) (Oral)  Ht  (1.702 m)  Wt 187 lb 3.2 oz (84.913 kg)  BMI 29.31 kg/m2        Assessment & Plan:  1. Rash and nonspecific skin eruption - methylPREDNISolone  acetate (DEPO-MEDROL) injection 80 mg; Inject 1 mL (80 mg total) into the muscle once. - predniSONE (DELTASONE) 10 MG tablet; 1 tablet 4 times a day for 3 days,  1 tablet 3 times a day for 3 days,  1 tablet 2 times a day for 3 days, 1 tablet daily for 3 days  Dispense: 30 tablet; Refill: 0 -Continue hydroxyzine  2. Essential hypertension, benign -Blood pressure readings were good  3. Hyperlipidemia with target LDL less than 100 -Continue aggressive therapeutic lifestyle changes  Meds ordered this encounter  Medications  . methylPREDNISolone acetate (DEPO-MEDROL) injection 80 mg    Sig:     . predniSONE (DELTASONE) 10 MG tablet    Sig: 1 tablet 4 times a day for 3 days,  1 tablet 3 times a day for 3 days,  1 tablet 2 times a day for 3 days, 1 tablet daily for 3 days    Dispense:  30 tablet    Refill:  0  . hydrOXYzine (ATARAX/VISTARIL) 25 MG tablet    Sig: TAKE 1 TABLET BY MOUTH EVERY DAY AT BEDTIME AS NEEDED FOR ITCHING    Dispense:  30 tablet    Refill:  0   Patient Instructions  Continue to take hydroxyzine Take prednisone as directed Call in one week regarding progress Avoid the heat as much as possible Try head and shoulders shampoo Keep the home is cool as possible   Nyra Capeson W. Kayle Passarelli MD

## 2015-10-03 ENCOUNTER — Encounter: Payer: Self-pay | Admitting: Family Medicine

## 2015-10-09 ENCOUNTER — Encounter: Payer: Self-pay | Admitting: *Deleted

## 2015-11-02 ENCOUNTER — Telehealth: Payer: Self-pay | Admitting: Family Medicine

## 2015-11-02 NOTE — Telephone Encounter (Signed)
I thought that we stop this for a while and the blood pressure went back up. I'm not sure how the rash responded to our discontinuing the Benicar. However, it was stopped it again and please call a prescription in for amlodipine 5 mg 1 daily. Have patient check blood pressures at home and come by in 2 weeks to have a blood pressure rechecked in the office. He should continue to watch his sodium intake.

## 2015-11-03 ENCOUNTER — Encounter: Payer: Self-pay | Admitting: Family Medicine

## 2015-11-03 MED ORDER — AMLODIPINE BESYLATE 5 MG PO TABS: 5.0000 mg | ORAL_TABLET | Freq: Every day | ORAL | Status: DC

## 2015-11-03 NOTE — Telephone Encounter (Signed)
Spoke with Wife and aware that new med is at [facility]

## 2015-11-20 ENCOUNTER — Ambulatory Visit: Payer: BLUE CROSS/BLUE SHIELD | Admitting: Family Medicine

## 2015-11-28 ENCOUNTER — Encounter: Payer: Self-pay | Admitting: Family Medicine

## 2015-12-04 ENCOUNTER — Encounter: Payer: Self-pay | Admitting: Family Medicine

## 2015-12-04 ENCOUNTER — Ambulatory Visit (INDEPENDENT_AMBULATORY_CARE_PROVIDER_SITE_OTHER): Payer: BLUE CROSS/BLUE SHIELD | Admitting: Family Medicine

## 2015-12-04 VITALS — BP 148/90 | HR 61 | Temp 97.0°F | Ht 67.0 in | Wt 187.0 lb

## 2015-12-04 DIAGNOSIS — R21 Rash and other nonspecific skin eruption: Secondary | ICD-10-CM

## 2015-12-04 DIAGNOSIS — E785 Hyperlipidemia, unspecified: Secondary | ICD-10-CM | POA: Diagnosis not present

## 2015-12-04 DIAGNOSIS — I10 Essential (primary) hypertension: Secondary | ICD-10-CM

## 2015-12-04 DIAGNOSIS — E559 Vitamin D deficiency, unspecified: Secondary | ICD-10-CM | POA: Diagnosis not present

## 2015-12-04 DIAGNOSIS — R768 Other specified abnormal immunological findings in serum: Secondary | ICD-10-CM

## 2015-12-04 NOTE — Progress Notes (Signed)
Subjective:    Patient ID: Levi Riddle, male    DOB: [birth year], 55 y.o.   MRN: [id number]  HPI Patient here today for BP follow up and possible medication change. The patient is doing much better with his rash and as it turned out he was allergic to the Benicar or angiotensin receptor blocker as well as lisinopril. His rash is much improved but he still has some residuals from this. As a result of the testing by the dermatologist his ANA was positive and he has an appointment with the rheumatologist to make sure that he does not have lupus. This appointment is not until October. He is feeling better. He was taking amlodipine 5 mg and felt very bad with this and discontinued this a couple of days ago. He brings in blood pressures since that time and no diastolic is running in the mid 80s. He only took the amlodipine for 2 weeks. He wants to wait and see if he can lose some weight and get his blood pressure under control without medication. We will ask him to bring blood pressure readings and in about 4 weeks to follow-up on this. The patient denies any chest pain or shortness of breath. He has no trouble with swallowing and only has occasional heartburn maybe once a month secondary to food intake. He denies any blood in the stool or black tarry bowel movements. He is due to get a colonoscopy and has been contacted by the gastroenterologist and he will arrange to have this done sometime this fall. He is passing his water without problems.    Patient Active Problem List   Diagnosis Date Noted  . Rash and nonspecific skin eruption 06/21/2015  . BPH   . Hyperlipidemia with target LDL less than 100   . Essential hypertension, benign   . IBS (irritable bowel syndrome)   . Spondylosis    Outpatient Encounter Prescriptions as of 12/04/2015  Medication Sig  . aspirin 81 MG EC tablet Take 81 mg by mouth daily.    . cholecalciferol (VITAMIN D) 1000 UNITS tablet Take 1,000 Units by mouth daily.  .  hydrOXYzine (ATARAX/VISTARIL) 25 MG tablet TAKE 1 TABLET BY MOUTH EVERY DAY AT BEDTIME AS NEEDED FOR ITCHING  . amLODipine (NORVASC) 5 MG tablet Take 1 tablet (5 mg total) by mouth daily. (Patient not taking: Reported on 12/04/2015)  . [DISCONTINUED] predniSONE (DELTASONE) 10 MG tablet 1 tablet 4 times a day for 3 days,  1 tablet 3 times a day for 3 days,  1 tablet 2 times a day for 3 days, 1 tablet daily for 3 days   No facility-administered encounter medications on file as of 12/04/2015.      Review of Systems  Constitutional: Negative.   HENT: Negative.   Eyes: Negative.   Respiratory: Negative.   Cardiovascular: Negative.   Gastrointestinal: Negative.   Endocrine: Negative.   Genitourinary: Negative.   Musculoskeletal: Negative.   Skin: Negative.   Allergic/Immunologic: Negative.   Neurological: Negative.   Hematological: Negative.   Psychiatric/Behavioral: Negative.        Objective:   Physical Exam  Constitutional: He is oriented to person, place, and time. He appears well-developed and well-nourished. No distress.  HENT:  Head: Normocephalic and atraumatic.  Nose: Nose normal.  Mouth/Throat: Oropharynx is clear and moist. No oropharyngeal exudate.  Some earwax bilaterally  Eyes: Conjunctivae and EOM are normal. Pupils are equal, round, and reactive to light. Right eye exhibits no discharge. Left eye  exhibits no discharge. No scleral icterus.  Neck: Normal range of motion. Neck supple. No thyromegaly present.  No bruits thyromegaly or anterior cervical adenopathy  Cardiovascular: Normal rate, regular rhythm, normal heart sounds and intact distal pulses.   No murmur heard. Heart is regular at 60/m  Pulmonary/Chest: Effort normal and breath sounds normal. No respiratory distress. He has no wheezes. He has no rales. He exhibits no tenderness.  Clear anteriorly and posteriorly  Abdominal: Soft. Bowel sounds are normal. He exhibits no mass. There is no tenderness. There is  no rebound and no guarding.  Musculoskeletal: Normal range of motion. He exhibits no edema.  Lymphadenopathy:    He has no cervical adenopathy.  Neurological: He is alert and oriented to person, place, and time. He has normal reflexes. No cranial nerve deficit.  Skin: Skin is warm and dry. Rash noted.  The rash from the past persists but is fading in nature and no longer pruritic. The patient did see the dermatologist and he did some additional testing even though he thinks the rash is coming from his angiotensin receptor blocker. He also had a positive ANA and has an appointment set up for him to see a rheumatologist sometime in October.  Psychiatric: He has a normal mood and affect. His behavior is normal. Judgment and thought content normal.  Nursing note and vitals reviewed.  BP 145/86 mmHg  Pulse 61  Temp(Src) 97 F (36.1 C) (Oral)  Ht 5\' 7"  (1.702 m)  Wt 187 lb (84.823 kg)  BMI 29.28 kg/m2  Repeat blood pressure by me was 148/90      Assessment & Plan:  1. Hyperlipidemia with target LDL less than 100 -Continue with aggressive therapeutic lifestyle changes  2. Essential hypertension, benign -Monitor blood pressures closely at home. Make every effort to lose some weight and watch sodium intake more closely. Bring blood pressures by for review in 4 weeks. If the systolic goes up greater than 161 and the diastolic stays greater than 90 the patient understands that he will start back on amlodipine 5 mg one half tablet daily. He will document this in his records that he returns in 4 weeks.  3. Vitamin D deficiency -Tinny current treatment  4. Rash and nonspecific skin eruption -Follow-up with dermatology as planned -Follow-up with rheumatology as planned and we will see if we can get an earlier appointment if that is possible  Patient Instructions  Continue current medications. Continue good therapeutic lifestyle changes which include good diet and exercise. Fall precautions  discussed with patient. If an FOBT was given today- please return it to our front desk. If you are over 46 years old - you may need Prevnar 13 or the adult Pneumonia vaccine.   After your visit with [place] today you will receive a survey in the mail or online from [employer] regarding your care with [place]. Please take a moment to fill this out. Your feedback is very important to [place] as you can help [place] better understand your patient needs as well as improve your experience and satisfaction. WE CARE ABOUT YOU!!!   Bring blood pressures by for review in 4 weeks Make every effort at losing weight and watching sodium intake If the blood pressure jumped up and stay up greater than 140/90, restart amlodipine 5 mg one half tablet daily. Document this on your blood pressure record. Follow-up with dermatology as planned We will see if we can get an earlier appointment with the rheumatologist and if we can  we will notify you of this.      Nyra Capeson W. Abdoulaye Drum MD

## 2015-12-04 NOTE — Patient Instructions (Addendum)
Continue current medications. Continue good therapeutic lifestyle changes which include good diet and exercise. Fall precautions discussed with patient. If an FOBT was given today- please return it to our front desk. If you are over 57 years old - you may need Prevnar 13 or the adult Pneumonia vaccine.   After your visit with us today you will receive a survey in the mail or online from [employer] regarding your care with us. Please take a moment to fill this out. Your feedback is very important to us as you can help us better understand your patient needs as well as improve your experience and satisfaction. WE CARE ABOUT YOU!!!   Bring blood pressures by for review in 4 weeks Make every effort at losing weight and watching sodium intake If the blood pressure jumped up and stay up greater than 140/90, restart amlodipine 5 mg one half tablet daily. Document this on your blood pressure record. Follow-up with dermatology as planned We will see if we can get an earlier appointment with the rheumatologist and if we can we will notify you of this.

## 2015-12-05 NOTE — Addendum Note (Signed)
Addended by: Magdalene RiverBULLINS, Zhanae Proffit H on: 12/05/2015 11:53 AM   Modules accepted: Orders

## 2015-12-06 ENCOUNTER — Ambulatory Visit: Payer: BLUE CROSS/BLUE SHIELD | Admitting: Family Medicine

## 2015-12-27 ENCOUNTER — Encounter: Payer: Self-pay | Admitting: Gastroenterology

## 2016-02-02 ENCOUNTER — Ambulatory Visit (AMBULATORY_SURGERY_CENTER): Payer: Self-pay

## 2016-02-02 VITALS — Ht 69.0 in | Wt 190.6 lb

## 2016-02-02 DIAGNOSIS — Z1211 Encounter for screening for malignant neoplasm of colon: Secondary | ICD-10-CM

## 2016-02-02 MED ORDER — SUPREP BOWEL PREP KIT 17.5-3.13-1.6 GM/177ML PO SOLN: 1.0000 | Freq: Once | ORAL | 0 refills | Status: AC

## 2016-02-02 NOTE — Progress Notes (Signed)
No allergies to eggs or soy No past problems with anesthesia No diet meds No home oxygen  Declined emmi 

## 2016-02-06 ENCOUNTER — Encounter: Payer: Self-pay | Admitting: Family Medicine

## 2016-02-07 MED ORDER — AMLODIPINE BESYLATE 5 MG PO TABS: 5.0000 mg | ORAL_TABLET | Freq: Every day | ORAL | 0 refills | Status: DC

## 2016-02-07 MED ORDER — AMLODIPINE BESYLATE 5 MG PO TABS: 7.5000 mg | ORAL_TABLET | Freq: Every day | ORAL | 1 refills | Status: DC

## 2016-02-08 ENCOUNTER — Encounter: Payer: Self-pay | Admitting: Gastroenterology

## 2016-02-15 ENCOUNTER — Encounter: Payer: Self-pay | Admitting: Gastroenterology

## 2016-02-15 ENCOUNTER — Ambulatory Visit (AMBULATORY_SURGERY_CENTER): Payer: BLUE CROSS/BLUE SHIELD | Admitting: Gastroenterology

## 2016-02-15 VITALS — BP 124/87 | HR 65 | Temp 99.1°F | Resp 24 | Ht 69.0 in | Wt 190.0 lb

## 2016-02-15 DIAGNOSIS — Z1211 Encounter for screening for malignant neoplasm of colon: Secondary | ICD-10-CM | POA: Diagnosis present

## 2016-02-15 DIAGNOSIS — Z1212 Encounter for screening for malignant neoplasm of rectum: Secondary | ICD-10-CM

## 2016-02-15 MED ORDER — SODIUM CHLORIDE 0.9 % IV SOLN: 500.0000 mL | INTRAVENOUS | Status: DC

## 2016-02-15 NOTE — Patient Instructions (Signed)
YOU HAD AN ENDOSCOPIC PROCEDURE TODAY AT THE [facility]:   Refer to the procedure report that was given to you for any specific questions about what was found during the examination.  If the procedure report does not answer your questions, please call your gastroenterologist to clarify.  If you requested that your care partner not be given the details of your procedure findings, then the procedure report has been included in a sealed envelope for you to review at your convenience later.  YOU SHOULD EXPECT: Some feelings of bloating in the abdomen. Passage of more gas than usual.  Walking can help get rid of the air that was put into your GI tract during the procedure and reduce the bloating. If you had a lower endoscopy (such as a colonoscopy or flexible sigmoidoscopy) you may notice spotting of blood in your stool or on the toilet paper. If you underwent a bowel prep for your procedure, you may not have a normal bowel movement for a few days.  Please Note:  You might notice some irritation and congestion in your nose or some drainage.  This is from the oxygen used during your procedure.  There is no need for concern and it should clear up in a day or so.  SYMPTOMS TO REPORT IMMEDIATELY:   Following lower endoscopy (colonoscopy or flexible sigmoidoscopy):  Excessive amounts of blood in the stool  Significant tenderness or worsening of abdominal pains  Swelling of the abdomen that is new, acute  Fever of 100F or higher    For urgent or emergent issues, a gastroenterologist can be reached at any hour by calling [phone].   DIET:  We do recommend a small meal at first, but then you may proceed to your regular diet.  Drink plenty of fluids but you should avoid alcoholic beverages for 24 hours.  ACTIVITY:  You should plan to take it easy for the rest of today and you should NOT DRIVE or use heavy machinery until tomorrow (because of the sedation medicines used during the test).     FOLLOW UP: Our staff will call the number listed on your records the next business day following your procedure to check on you and address any questions or concerns that you may have regarding the information given to you following your procedure. If we do not reach you, we will leave a message.  However, if you are feeling well and you are not experiencing any problems, there is no need to return our call.  We will assume that you have returned to your regular daily activities without incident.  If any biopsies were taken you will be contacted by phone or by letter within the next 1-3 weeks.  Please call us at [phone] if you have not heard about the biopsies in 3 weeks.    SIGNATURES/CONFIDENTIALITY: You and/or your care partner have signed paperwork which will be entered into your electronic medical record.  These signatures attest to the fact that that the information above on your After Visit Summary has been reviewed and is understood.  Full responsibility of the confidentiality of this discharge information lies with you and/or your care-partner. .  INFORMATION ON DIVERTICULOSIS AND HEMORRHOIDS GIVEN TO YOU TODAY 

## 2016-02-15 NOTE — Op Note (Signed)
[facility] Patient Name: Yaniv Lage Procedure Date: 02/15/2016 8:36 AM MRN: [id number] Endoscopist: Viviann Spare P. Adela Lank , MD Age: 55 Referring MD:  Date of Birth: 03/18/1961 Gender: Male Account #: [id number] Procedure:                Colonoscopy Indications:              Screening for malignant neoplasm in the colon, last                            colonoscopy in 2005 Medicines:                Monitored Anesthesia Care Procedure:                Pre-Anesthesia Assessment:                           - Prior to the procedure, a History and Physical                            was performed, and patient medications and                            allergies were reviewed. The patient's tolerance of                            previous anesthesia was also reviewed. The risks                            and benefits of the procedure and the sedation                            options and risks were discussed with the patient.                            All questions were answered, and informed consent                            was obtained. Prior Anticoagulants: The patient has                            taken aspirin, last dose was 1 day prior to                            procedure. ASA Grade Assessment: II - A patient                            with mild systemic disease. After reviewing the                            risks and benefits, the patient was deemed in                            satisfactory condition to undergo the procedure.  After obtaining informed consent, the colonoscope                            was passed under direct vision. Throughout the                            procedure, the patient's blood pressure, pulse, and                            oxygen saturations were monitored continuously. The                            Model CF-HQ190L [id number]) scope was introduced                            through the anus and advanced to  the the cecum,                            identified by appendiceal orifice and ileocecal                            valve. The colonoscopy was performed without                            difficulty. The patient tolerated the procedure                            well. The quality of the bowel preparation was                            good. The ileocecal valve, appendiceal orifice, and                            rectum were photographed. Scope In: 8:40:37 AM Scope Out: 8:53:15 AM Scope Withdrawal Time: 0 hours 10 minutes 14 seconds  Total Procedure Duration: 0 hours 12 minutes 38 seconds  Findings:                 The perianal and digital rectal examinations were                            normal.                           Scattered medium-mouthed diverticula were found in                            the left colon.                           Non-bleeding internal hemorrhoids were found during                            retroflexion. The hemorrhoids were small.  The exam was otherwise without abnormality. No                            polyps Complications:            No immediate complications. Estimated blood loss:                            None. Estimated Blood Loss:     Estimated blood loss: none. Impression:               - Diverticulosis in the left colon.                           - Non-bleeding internal hemorrhoids.                           - The examination was otherwise normal. No polyps Recommendation:           - Patient has a contact number available for                            emergencies. The signs and symptoms of potential                            delayed complications were discussed with the                            patient. Return to normal activities tomorrow.                            Written discharge instructions were provided to the                            patient.                           - Resume previous diet.                            - Continue present medications.                           - Repeat colonoscopy in 10 years for screening                            purposes. Viviann SpareSteven P. Adela LankArmbruster, MD 02/15/2016 8:56:43 AM This report has been signed electronically.

## 2016-02-15 NOTE — Progress Notes (Signed)
Spontaneous respirations throughout. VSS. Resting comfortably. To PACU on room air. Report to  Penny RN.  

## 2016-02-16 ENCOUNTER — Telehealth: Payer: Self-pay | Admitting: *Deleted

## 2016-02-16 NOTE — Telephone Encounter (Signed)
  Follow up Call-  Call back number 02/15/2016  Post procedure Call Back phone  #  [phone]  Permission to leave phone message Yes  Some recent data might be hidden     Patient questions:  Left message for patient to call us if necessary.

## 2016-02-19 ENCOUNTER — Telehealth: Payer: Self-pay

## 2016-02-19 NOTE — Telephone Encounter (Signed)
  Follow up Call-  Call back number 02/15/2016  Post procedure Call Back phone  #  [phone]  Permission to leave phone message Yes  Some recent data might be hidden     Patient was called for follow up after his procedure on 02/15/2016. No answer at the number given for follow up phone call. A message was left on the answering machine. This was the second attempt to contact the patient.

## 2016-07-25 ENCOUNTER — Other Ambulatory Visit: Payer: Self-pay | Admitting: Family Medicine

## 2016-08-29 ENCOUNTER — Encounter: Payer: Self-pay | Admitting: Family Medicine

## 2016-08-30 ENCOUNTER — Other Ambulatory Visit: Payer: Self-pay | Admitting: *Deleted

## 2016-08-30 MED ORDER — ALPRAZOLAM 0.25 MG PO TABS: 0.2500 mg | ORAL_TABLET | ORAL | 0 refills | Status: DC

## 2016-08-30 NOTE — Progress Notes (Signed)
Rx for Xanax called into [facility] per pt request Okayed per Dr Christell Constant

## 2016-09-02 ENCOUNTER — Telehealth: Payer: Self-pay | Admitting: Family Medicine

## 2016-09-02 NOTE — Telephone Encounter (Signed)
Xanax phoned in per Dr. Christell Constant instructions on patient email.

## 2016-10-30 ENCOUNTER — Other Ambulatory Visit: Payer: Self-pay | Admitting: Family Medicine

## 2016-12-12 ENCOUNTER — Encounter: Payer: Self-pay | Admitting: Family Medicine

## 2016-12-12 ENCOUNTER — Ambulatory Visit (INDEPENDENT_AMBULATORY_CARE_PROVIDER_SITE_OTHER): Payer: BLUE CROSS/BLUE SHIELD | Admitting: Family Medicine

## 2016-12-12 VITALS — BP 137/80 | HR 72 | Temp 97.1°F | Ht 69.0 in | Wt 184.0 lb

## 2016-12-12 DIAGNOSIS — N400 Enlarged prostate without lower urinary tract symptoms: Secondary | ICD-10-CM

## 2016-12-12 DIAGNOSIS — E559 Vitamin D deficiency, unspecified: Secondary | ICD-10-CM

## 2016-12-12 DIAGNOSIS — Z0000 Encounter for general adult medical examination without abnormal findings: Secondary | ICD-10-CM | POA: Diagnosis not present

## 2016-12-12 DIAGNOSIS — I10 Essential (primary) hypertension: Secondary | ICD-10-CM

## 2016-12-12 DIAGNOSIS — E785 Hyperlipidemia, unspecified: Secondary | ICD-10-CM

## 2016-12-12 MED ORDER — AMLODIPINE BESYLATE 5 MG PO TABS: 7.5000 mg | ORAL_TABLET | Freq: Every day | ORAL | 3 refills | Status: DC

## 2016-12-12 NOTE — Patient Instructions (Addendum)
Continue current medications. Continue good therapeutic lifestyle changes which include good diet and exercise. Fall precautions discussed with patient. If an FOBT was given today- please return it to our front desk. If you are over 58 years old - you may need Prevnar 13 or the adult Pneumonia vaccine.  **Flu shots are available--- please call and schedule a FLU-CLINIC appointment**  After your visit with us today you will receive a survey in the mail or online from [employer] regarding your care with us. Please take a moment to fill this out. Your feedback is very important to us as you can help us better understand your patient needs as well as improve your experience and satisfaction. WE CARE ABOUT YOU!!!   Continue with good preventative care Follow-up with dermatologist as discussed for a good total body screen Continue with aggressive therapeutic lifestyle changes and drinking plenty of water and avoiding sodium

## 2016-12-12 NOTE — Progress Notes (Signed)
Subjective:    Patient ID: Levi Riddle, male    DOB: November 13, 1960, 56 y.o.   MRN: [id number]  HPI Patient is here today for annual wellness exam and follow up of chronic medical problems which includes hyperlipidemia. He is taking medication regularly.The patient is doing well with no specific complaints. We did have some problems with allergies to ACE inhibitor's and angiotensin receptor blockers and he is now on a calcium channel blocker and doing well with this. His weight today is 184 pounds which is down 6 pounds from the previous visit. His vital signs are stable. He had a colonoscopy last September. He will get lab work today with a traditional lipid panel. The patient is pleasant and alert today. His father died this past Christmas and was 55 years old and had been in the nursing home at the time and may have choked and aspirated on some food as a cause of death. He did have prostate cancer by history. His 2 sons are alive and well. The patient denies any chest pain palpitations or shortness of breath. He denies any trouble with swallowing heartburn indigestion nausea vomiting diarrhea or blood in the stool. He's passing his water well and has no sexual dysfunction. He is going to continue to work for another 18 months of so and then he and his wife plan to move to the [place] to be closer to their son at that time. Patient says that his blood pressures at home have been running in the 130s over the 70s. He is up-to-date on his eye exams.    Patient Active Problem List   Diagnosis Date Noted  . Rash and nonspecific skin eruption 06/21/2015  . BPH   . Hyperlipidemia with target LDL less than 100   . Essential hypertension, benign   . IBS (irritable bowel syndrome)   . Spondylosis    Outpatient Encounter Prescriptions as of 12/12/2016  Medication Sig  . ALPRAZolam (XANAX) 0.25 MG tablet Take 1 tablet (0.25 mg total) by mouth as directed. Take 1/2-1 tablet prior to flight  . amLODipine  (NORVASC) 5 MG tablet TAKE 1 AND 1/2 TABLETS DAILY  . aspirin 81 MG EC tablet Take 81 mg by mouth daily.    . cholecalciferol (VITAMIN D) 1000 UNITS tablet Take 1,000 Units by mouth daily.  . [DISCONTINUED] hydrOXYzine (ATARAX/VISTARIL) 25 MG tablet TAKE 1 TABLET BY MOUTH EVERY DAY AT BEDTIME AS NEEDED FOR ITCHING  . [DISCONTINUED] 0.9 %  sodium chloride infusion    No facility-administered encounter medications on file as of 12/12/2016.       Review of Systems  Constitutional: Negative.   HENT: Negative.   Eyes: Negative.   Respiratory: Negative.   Cardiovascular: Negative.   Gastrointestinal: Negative.   Endocrine: Negative.   Genitourinary: Negative.   Musculoskeletal: Negative.   Skin: Negative.   Allergic/Immunologic: Negative.   Neurological: Negative.   Hematological: Negative.   Psychiatric/Behavioral: Negative.        Objective:   Physical Exam  Constitutional: He is oriented to person, place, and time. He appears well-developed and well-nourished. No distress.  The patient is pleasant and alert  HENT:  Head: Normocephalic and atraumatic.  Right Ear: External ear normal.  Left Ear: External ear normal.  Nose: Nose normal.  Mouth/Throat: Oropharynx is clear and moist. No oropharyngeal exudate.  Eyes: Pupils are equal, round, and reactive to light. Conjunctivae and EOM are normal. Right eye exhibits no discharge. Left eye exhibits no discharge. No  scleral icterus.  Neck: Normal range of motion. Neck supple. No thyromegaly present.  Cardiovascular: Normal rate, regular rhythm, normal heart sounds and intact distal pulses.   No murmur heard. Heart is regular at 60/m  Pulmonary/Chest: Effort normal and breath sounds normal. No respiratory distress. He has no wheezes. He has no rales. He exhibits no tenderness.  Clear anteriorly and posteriorly and no axillary adenopathy  Abdominal: Soft. Bowel sounds are normal. He exhibits no mass. There is no tenderness. There is no  rebound and no guarding.  Nontender without masses or organ enlargement or bruits  Genitourinary: Rectum normal and penis normal.  Genitourinary Comments: Prostate is only slightly enlarged but soft and smooth. There were no rectal masses. There were no inguinal hernias palpable. External genitalia were within normal limits.  Musculoskeletal: Normal range of motion. He exhibits no edema.  Lymphadenopathy:    He has no cervical adenopathy.  Neurological: He is alert and oriented to person, place, and time. He has normal reflexes. No cranial nerve deficit.  Skin: Skin is warm and dry. No rash noted.  Psychiatric: He has a normal mood and affect. His behavior is normal. Judgment and thought content normal.  Nursing note and vitals reviewed.  BP 137/80 (BP Location: Left Arm)   Pulse 72   Temp (!) 97.1 F (36.2 C) (Oral)   Ht 5' 9" (1.753 m)   Wt 184 lb (83.5 kg)   BMI 27.17 kg/m         Assessment & Plan:  1. Annual physical exam -The patient is currently up-to-date on all of his preventative measures. He will be due a shingles shot when he turns 58. He will also be due a Prevnar vaccine when he turns 63 and a Pneumovax one year later. -He should continue getting his colonoscopies every 10 years with the next one being due in 2027. -He should continue getting annual rectal exams with PSAs - BMP8+EGFR - CBC with Differential/Platelet - Lipid panel - Hepatic function panel - Urinalysis, Complete - PSA, total and free  2. Hyperlipidemia with target LDL less than 100 -Continue aggressive therapeutic lifestyle changes pending results of lab work - Lipid panel  3. Essential hypertension, benign -Continue with amlodipine as blood pressure was good today. - BMP8+EGFR - Hepatic function panel  4. Vitamin D deficiency -Continue with vitamin D replacement  5. Benign prostatic hyperplasia, unspecified whether lower urinary tract symptoms present -The prostate is slightly enlarged  but has no issues with voiding. - Urinalysis, Complete - PSA, total and free  Meds ordered this encounter  Medications  . amLODipine (NORVASC) 5 MG tablet    Sig: Take 1.5 tablets (7.5 mg total) by mouth daily.    Dispense:  135 tablet    Refill:  3   Patient Instructions  Continue current medications. Continue good therapeutic lifestyle changes which include good diet and exercise. Fall precautions discussed with patient. If an FOBT was given today- please return it to our front desk. If you are over 53 years old - you may need Prevnar 75 or the adult Pneumonia vaccine.  **Flu shots are available--- please call and schedule a FLU-CLINIC appointment**  After your visit with [place] today you will receive a survey in the mail or online from [employer] regarding your care with [place]. Please take a moment to fill this out. Your feedback is very important to [place] as you can help [place] better understand your patient needs as well as improve your experience and  satisfaction. WE CARE ABOUT YOU!!!   Continue with good preventative care Follow-up with dermatologist as discussed for a good total body screen Continue with aggressive therapeutic lifestyle changes and drinking plenty of water and avoiding sodium  Arrie Senate MD

## 2016-12-13 ENCOUNTER — Other Ambulatory Visit: Payer: Self-pay | Admitting: *Deleted

## 2016-12-13 DIAGNOSIS — E785 Hyperlipidemia, unspecified: Secondary | ICD-10-CM

## 2016-12-13 LAB — CBC WITH DIFFERENTIAL/PLATELET
BASOS: 0 %
Basophils Absolute: 0 10*3/uL (ref 0.0–0.2)
EOS (ABSOLUTE): 0.1 10*3/uL (ref 0.0–0.4)
EOS: 3 %
HEMATOCRIT: 46.7 % (ref 37.5–51.0)
Hemoglobin: 16.2 g/dL (ref 13.0–17.7)
IMMATURE GRANS (ABS): 0 10*3/uL (ref 0.0–0.1)
Immature Granulocytes: 0 %
Lymphocytes Absolute: 1.2 10*3/uL (ref 0.7–3.1)
Lymphs: 25 %
MCH: 31.5 pg (ref 26.6–33.0)
MCHC: 34.7 g/dL (ref 31.5–35.7)
MCV: 91 fL (ref 79–97)
MONOS ABS: 0.5 10*3/uL (ref 0.1–0.9)
Monocytes: 9 %
NEUTROS ABS: 3.1 10*3/uL (ref 1.4–7.0)
Neutrophils: 63 %
PLATELETS: 180 10*3/uL (ref 150–379)
RBC: 5.15 x10E6/uL (ref 4.14–5.80)
RDW: 12.9 % (ref 12.3–15.4)
WBC: 4.9 10*3/uL (ref 3.4–10.8)

## 2016-12-13 LAB — LIPID PANEL
CHOL/HDL RATIO: 3.4 ratio (ref 0.0–5.0)
Cholesterol, Total: 202 mg/dL — ABNORMAL HIGH (ref 100–199)
HDL: 60 mg/dL (ref >39)
LDL Calculated: 124 mg/dL — ABNORMAL HIGH (ref 0–99)
Triglycerides: 89 mg/dL (ref 0–149)
VLDL CHOLESTEROL CAL: 18 mg/dL (ref 5–40)

## 2016-12-13 LAB — HEPATIC FUNCTION PANEL
ALT: 27 IU/L (ref 0–44)
AST: 24 IU/L (ref 0–40)
Albumin: 4.6 g/dL (ref 3.5–5.5)
Alkaline Phosphatase: 57 IU/L (ref 39–117)
BILIRUBIN TOTAL: 0.6 mg/dL (ref 0.0–1.2)
BILIRUBIN, DIRECT: 0.17 mg/dL (ref 0.00–0.40)
Total Protein: 6.5 g/dL (ref 6.0–8.5)

## 2016-12-13 LAB — BMP8+EGFR
BUN/Creatinine Ratio: 12 (ref 9–20)
BUN: 11 mg/dL (ref 6–24)
CO2: 24 mmol/L (ref 20–29)
CREATININE: 0.94 mg/dL (ref 0.76–1.27)
Calcium: 9.5 mg/dL (ref 8.7–10.2)
Chloride: 100 mmol/L (ref 96–106)
GFR, EST AFRICAN AMERICAN: 103 mL/min/{1.73_m2} (ref >59)
GFR, EST NON AFRICAN AMERICAN: 89 mL/min/{1.73_m2} (ref >59)
Glucose: 85 mg/dL (ref 65–99)
POTASSIUM: 4.7 mmol/L (ref 3.5–5.2)
SODIUM: 140 mmol/L (ref 134–144)

## 2016-12-13 LAB — URINALYSIS, COMPLETE
Bilirubin, UA: NEGATIVE
GLUCOSE, UA: NEGATIVE
Ketones, UA: NEGATIVE
LEUKOCYTES UA: NEGATIVE
Nitrite, UA: NEGATIVE
Protein, UA: NEGATIVE
RBC UA: NEGATIVE
Specific Gravity, UA: 1.015 (ref 1.005–1.030)
UUROB: 0.2 mg/dL (ref 0.2–1.0)
pH, UA: 6 (ref 5.0–7.5)

## 2016-12-13 LAB — PSA, TOTAL AND FREE
PSA, Free Pct: 29 %
PSA, Free: 0.29 ng/mL
Prostate Specific Ag, Serum: 1 ng/mL (ref 0.0–4.0)

## 2017-09-23 ENCOUNTER — Encounter: Payer: Self-pay | Admitting: *Deleted

## 2017-12-15 ENCOUNTER — Ambulatory Visit: Payer: BLUE CROSS/BLUE SHIELD | Admitting: Family Medicine

## 2017-12-16 ENCOUNTER — Other Ambulatory Visit: Payer: Self-pay | Admitting: *Deleted

## 2017-12-16 ENCOUNTER — Encounter: Payer: Self-pay | Admitting: Family Medicine

## 2017-12-17 MED ORDER — AMLODIPINE BESYLATE 5 MG PO TABS: 7.5000 mg | ORAL_TABLET | Freq: Every day | ORAL | 1 refills | Status: AC

## 2017-12-17 MED ORDER — ALPRAZOLAM 0.25 MG PO TABS: 0.2500 mg | ORAL_TABLET | ORAL | 5 refills | Status: AC

## 2017-12-29 ENCOUNTER — Ambulatory Visit: Payer: BLUE CROSS/BLUE SHIELD | Admitting: Family Medicine

## 2018-03-14 IMAGING — CR DG CHEST 2V
2 series · 2 of 2 positions shown · non-contrast
Comparison: 08/26/2013

CLINICAL DATA: Hypertension.  Annual physical.

EXAM:
CHEST  2 VIEW

[view not recorded (1 of 2)]
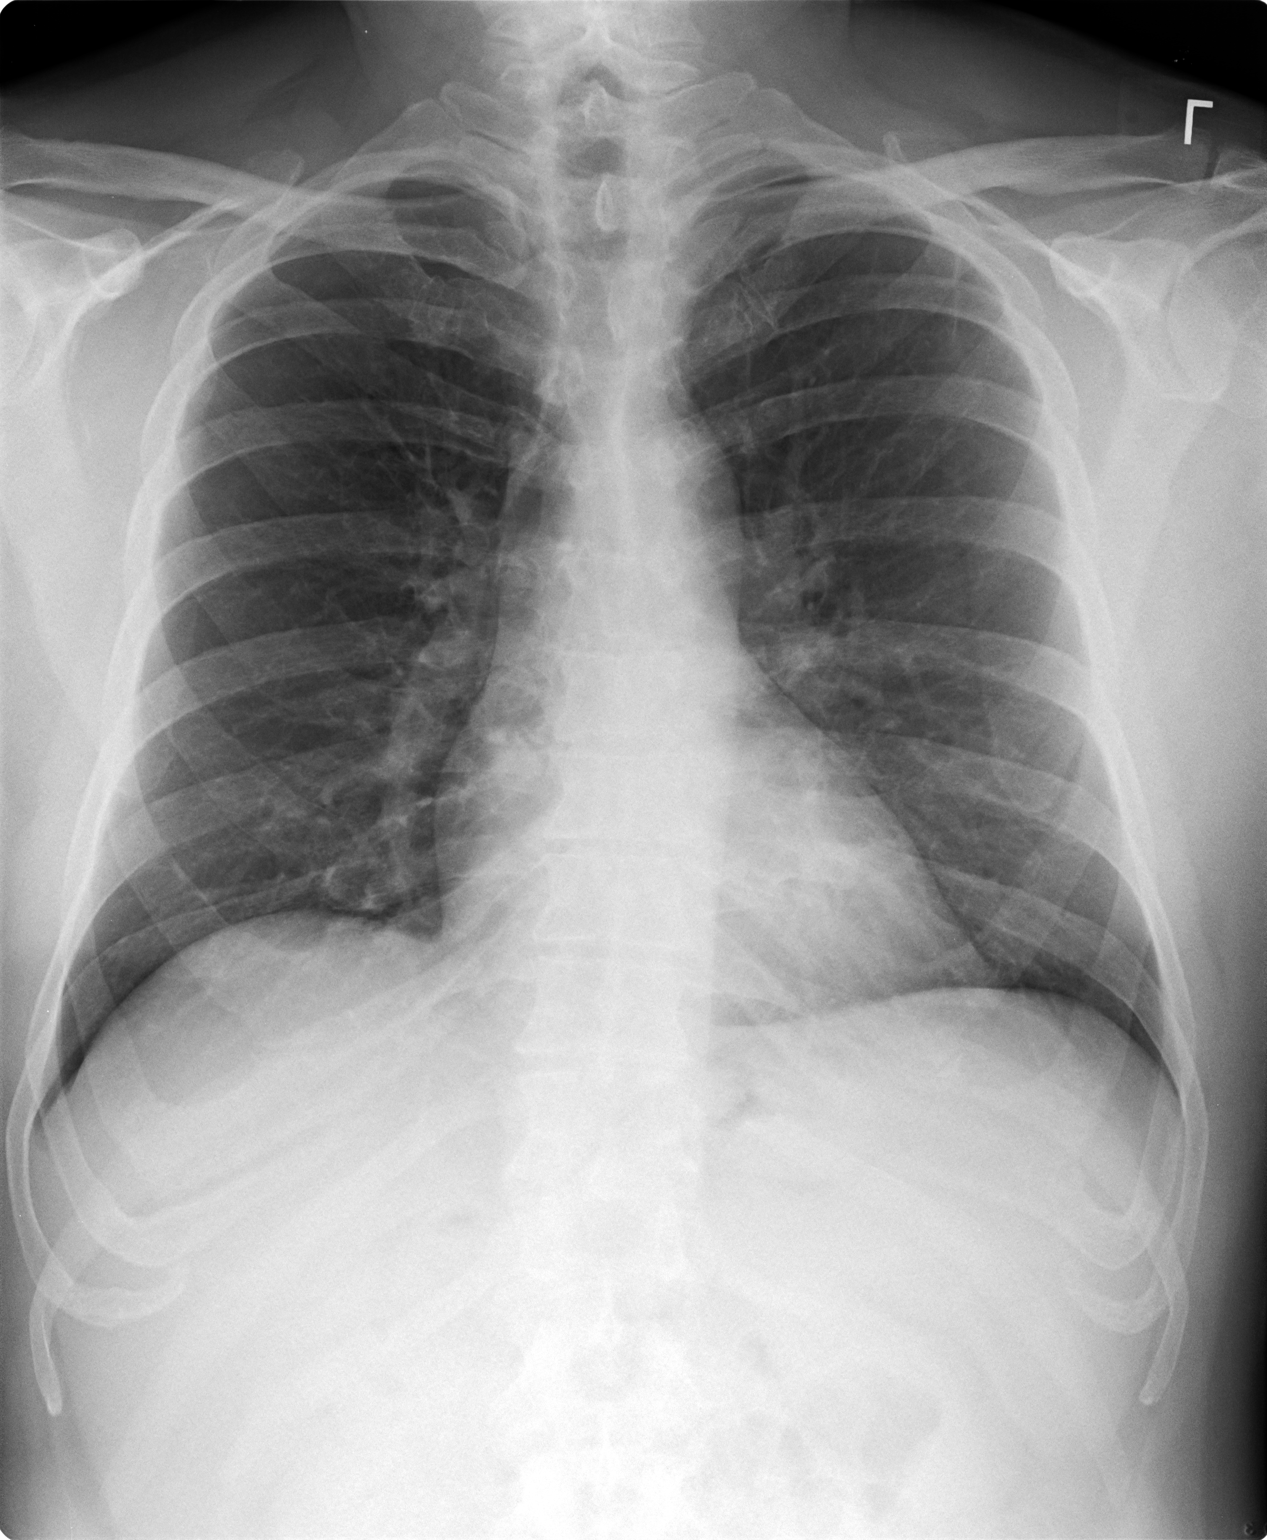

[view not recorded (2 of 2)]
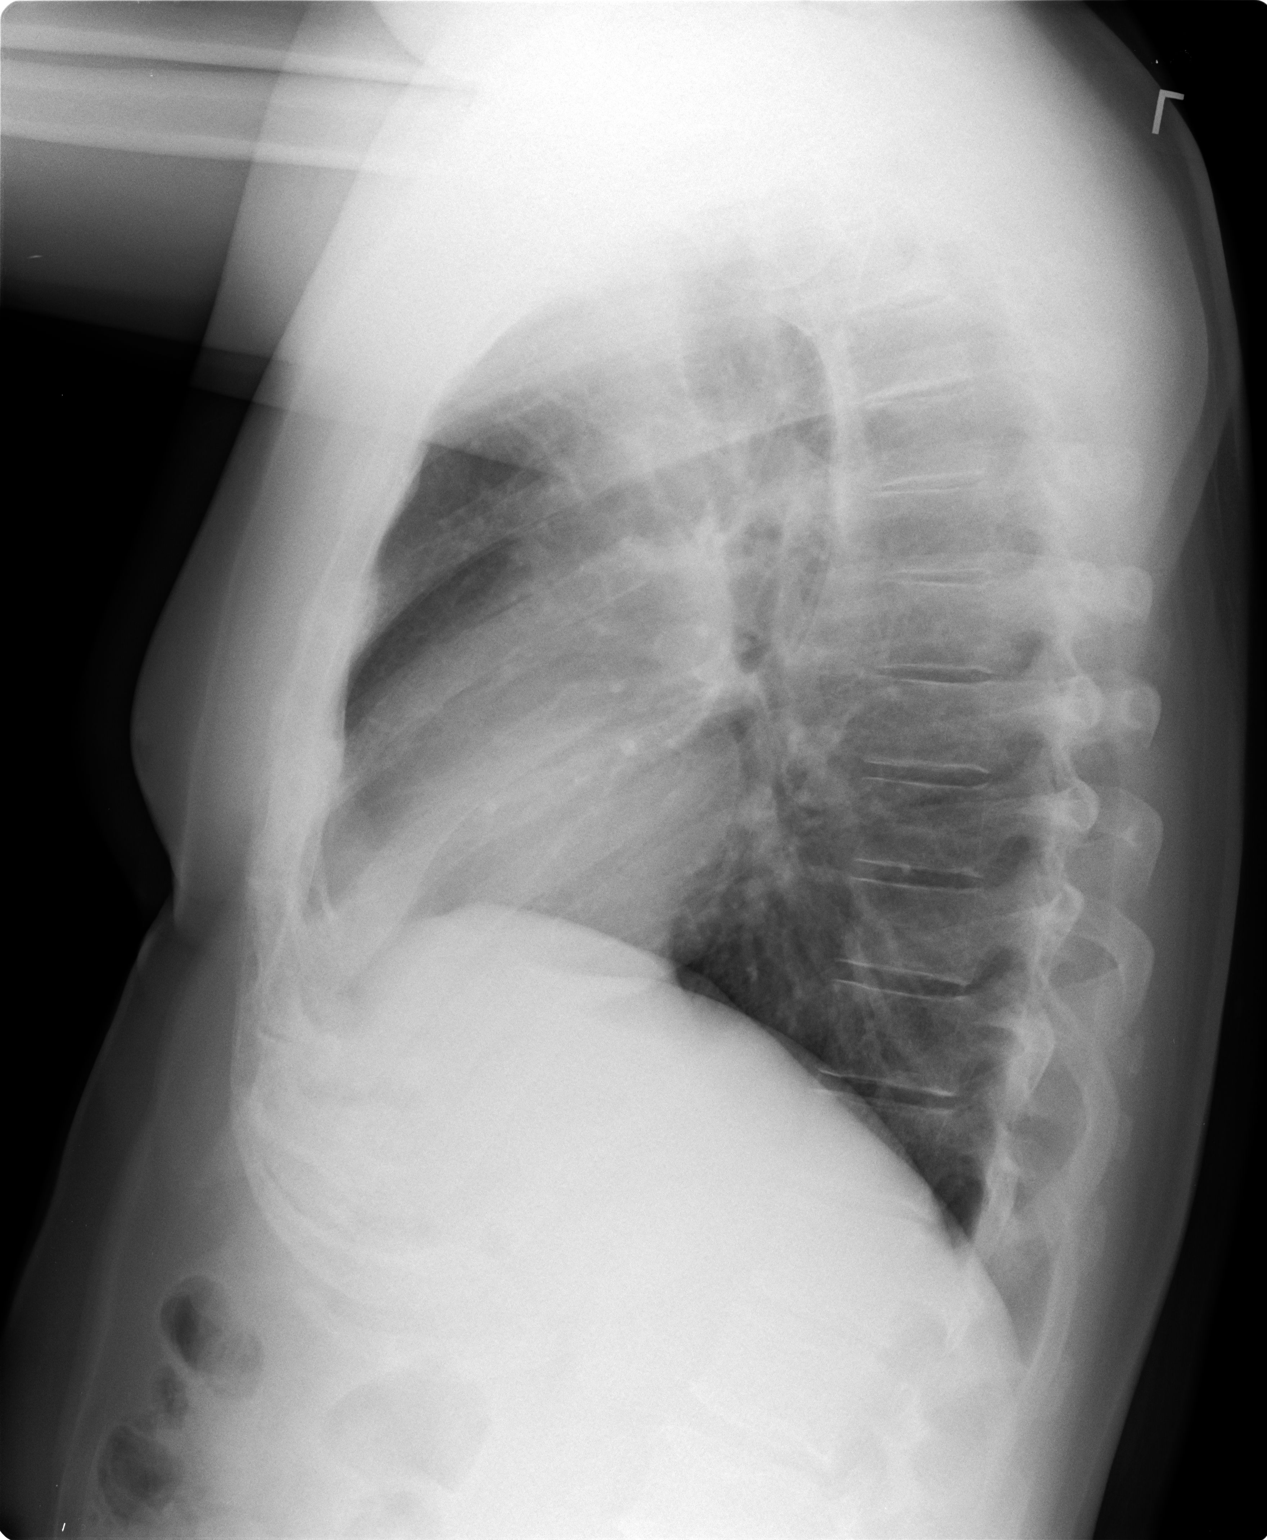

[2 of 2 positions shown; findings below may reference images not displayed]

FINDINGS: The heart size and mediastinal contours are within normal limits.
Both lungs are clear. The visualized skeletal structures are
unremarkable.
IMPRESSION: No active cardiopulmonary disease.

## 2018-07-22 ENCOUNTER — Other Ambulatory Visit: Payer: Self-pay | Admitting: Family Medicine
